# Patient Record
Sex: Female | Born: 1970 | Race: White | Hispanic: No | Marital: Married | Smoking: Former smoker
Health system: Southern US, Community
[De-identification: ages and names within clinical notes are randomized; demographics above are authoritative.]

## PROBLEM LIST (undated history)

## (undated) DIAGNOSIS — I10 Essential (primary) hypertension: Secondary | ICD-10-CM

## (undated) DIAGNOSIS — F32A Depression, unspecified: Secondary | ICD-10-CM

## (undated) HISTORY — PX: APPENDECTOMY: SHX54

## (undated) HISTORY — PX: CHOLECYSTECTOMY: SHX55

## (undated) HISTORY — PX: ABLATION: SHX5711

---

## 2012-07-20 ENCOUNTER — Emergency Department (INDEPENDENT_AMBULATORY_CARE_PROVIDER_SITE_OTHER)
Admission: EM | Admit: 2012-07-20 | Discharge: 2012-07-20 | Disposition: A | Discharge status: Home / Self Care | Payer: 59 | Source: Home / Self Care | Attending: Family Medicine | Admitting: Family Medicine

## 2012-07-20 ENCOUNTER — Encounter: Payer: Self-pay | Admitting: *Deleted

## 2012-07-20 DIAGNOSIS — B029 Zoster without complications: Secondary | ICD-10-CM

## 2012-07-20 MED ORDER — VALACYCLOVIR HCL 1 G PO TABS: 1000.0000 mg | ORAL_TABLET | Freq: Three times a day (TID) | ORAL | Status: DC

## 2012-07-20 MED ORDER — ONDANSETRON HCL 4 MG PO TABS: 4.0000 mg | ORAL_TABLET | Freq: Four times a day (QID) | ORAL | Status: DC

## 2012-07-20 MED ORDER — HYDROCODONE-ACETAMINOPHEN 5-325 MG PO TABS: 1.0000 | ORAL_TABLET | Freq: Four times a day (QID) | ORAL | Status: DC | PRN

## 2012-07-20 NOTE — ED Notes (Signed)
Patient c/o pain, itching, burning sensation on right upper side of back x 2 days.

## 2012-07-20 NOTE — ED Provider Notes (Signed)
History     CSN: [id number]  Arrival date & time 07/20/12  1320   First MD Initiated Contact with Patient 07/20/12 1339      Chief Complaint  Patient presents with  . Herpes Zoster       HPI Comments: Patient complains of onset of pain, burning, and itching in her right back and lateral chest in a band-like distribution.  She has had shingles two years ago and her present pain is exactly the same, although she has not seen a rash.  No cough or pleuritic pain.  No urinary symptoms.   No fevers, chills, and sweats   The history is provided by the patient.    History reviewed. No pertinent past medical history.  Past Surgical History  Procedure Laterality Date  . Appendectomy    . Cholecystectomy    . Ablation      Family history:  Father bladder cancer and type 2 diabetes;  Mother carcinoid tumors; maternal grandmother colon CA and COPD  History  Substance Use Topics  . Smoking status: Not on file  . Smokeless tobacco: Not on file  . Alcohol Use: Not on file    OB History   Grav Para Term Preterm Abortions TAB SAB Ect Mult Living                  Review of Systems  Constitutional: Positive for fatigue. Negative for fever.  HENT: Negative.   Eyes: Negative.   Respiratory: Negative.   Cardiovascular: Negative.   Gastrointestinal: Negative.   Genitourinary: Negative.   Musculoskeletal: Positive for back pain.  Skin: Negative for rash.  Neurological: Negative.     Allergies  Codeine; Morphine and related; and Other  Home Medications   Current Outpatient Rx  Name  Route  Sig  Dispense  Refill  . amitriptyline (ELAVIL) 10 MG tablet   Oral   Take 10 mg by mouth at bedtime.         Marland Kitchen escitalopram (LEXAPRO) 10 MG tablet   Oral   Take 10 mg by mouth daily.         . meloxicam (MOBIC) 15 MG tablet   Oral   Take 15 mg by mouth daily.         Marland Kitchen HYDROcodone-acetaminophen (NORCO/VICODIN) 5-325 MG per tablet   Oral   Take 1 tablet by mouth every 6  (six) hours as needed for pain.   12 tablet   0   . ondansetron (ZOFRAN) 4 MG tablet   Oral   Take 1 tablet (4 mg total) by mouth every 6 (six) hours. as needed for nausea   12 tablet   0   . valACYclovir (VALTREX) 1000 MG tablet   Oral   Take 1 tablet (1,000 mg total) by mouth 3 (three) times daily.   21 tablet   0     BP 141/95  Pulse 98  Temp(Src) 97.9 F (36.6 C) (Oral)  Resp 20  Ht 5' 1.5" (1.562 m)  Wt 189 lb 4 oz (85.843 kg)  BMI 35.18 kg/m2  SpO2 100%  Physical Exam  Nursing note and vitals reviewed. Constitutional: She is oriented to person, place, and time. She appears well-developed and well-nourished. No distress.  Patient is obese (BMI 35.2)  HENT:  Head: Normocephalic.  Mouth/Throat: Oropharynx is clear and moist.  Eyes: Conjunctivae are normal. Pupils are equal, round, and reactive to light.  Neck: Neck supple.  Cardiovascular: Normal heart sounds.   Pulmonary/Chest: Breath  sounds normal.  Abdominal: Soft. There is no tenderness.  Musculoskeletal: She exhibits no edema.  Lymphadenopathy:    She has no cervical adenopathy.  Neurological: She is alert and oriented to person, place, and time.  Skin: Skin is warm and dry. No abrasion, no bruising, no burn, no ecchymosis, no lesion, no petechiae and no rash noted. No erythema.     There is tenderness to palpation and pain (but no rash) in a dermatomal distribution on the right chest as noted on diagram.    ED Course  Procedures  none      1. Herpes zoster, early       MDM  Begin Valtrex.  Lortab and Zofran Q6hr prn Followup with Family Doctor if not improved in one week.         Lattie Haw, MD 07/22/12 1058

## 2012-07-22 ENCOUNTER — Telehealth: Payer: Self-pay | Admitting: Emergency Medicine

## 2018-03-12 ENCOUNTER — Encounter (HOSPITAL_BASED_OUTPATIENT_CLINIC_OR_DEPARTMENT_OTHER): Payer: Self-pay | Admitting: *Deleted

## 2018-03-12 ENCOUNTER — Other Ambulatory Visit: Payer: Self-pay

## 2018-03-12 ENCOUNTER — Emergency Department (HOSPITAL_BASED_OUTPATIENT_CLINIC_OR_DEPARTMENT_OTHER)
Admission: EM | Admit: 2018-03-12 | Discharge: 2018-03-12 | Disposition: A | Discharge status: Home / Self Care | Payer: Managed Care, Other (non HMO) | Attending: Emergency Medicine | Admitting: Emergency Medicine

## 2018-03-12 DIAGNOSIS — N390 Urinary tract infection, site not specified: Secondary | ICD-10-CM

## 2018-03-12 DIAGNOSIS — M545 Low back pain: Secondary | ICD-10-CM | POA: Diagnosis present

## 2018-03-12 DIAGNOSIS — Z79899 Other long term (current) drug therapy: Secondary | ICD-10-CM | POA: Diagnosis not present

## 2018-03-12 LAB — URINALYSIS, MICROSCOPIC (REFLEX)

## 2018-03-12 LAB — URINALYSIS, ROUTINE W REFLEX MICROSCOPIC
Bilirubin Urine: NEGATIVE
GLUCOSE, UA: NEGATIVE mg/dL
Ketones, ur: NEGATIVE mg/dL
Nitrite: POSITIVE — AB
PH: 7 (ref 5.0–8.0)
Specific Gravity, Urine: 1.01 (ref 1.005–1.030)

## 2018-03-12 LAB — PREGNANCY, URINE: Preg Test, Ur: NEGATIVE

## 2018-03-12 MED ORDER — CEPHALEXIN 250 MG PO CAPS
1000.0000 mg | ORAL_CAPSULE | Freq: Once | ORAL | Status: AC
  Administered 2018-03-12: 1000 mg via ORAL
  Filled 2018-03-12: qty 4

## 2018-03-12 MED ORDER — CEPHALEXIN 500 MG PO CAPS: 500.0000 mg | ORAL_CAPSULE | Freq: Two times a day (BID) | ORAL | 0 refills | Status: DC

## 2018-03-12 MED ORDER — FLUCONAZOLE 150 MG PO TABS: ORAL_TABLET | ORAL | 0 refills | Status: DC

## 2018-03-12 NOTE — ED Notes (Signed)
Patient is A&Ox4.  No signs of distress noted.  Please see providers complete history and physical exam.  

## 2018-03-12 NOTE — ED Triage Notes (Signed)
C/o bil back pain x 1 day w/o injury

## 2018-03-12 NOTE — ED Provider Notes (Signed)
[facility] Provider Note: Alyssa Dell, MD, FACEP  CSN: [id number] MRN: [id number] ARRIVAL: 03/12/18 at 0022 ROOM: [facility]   CHIEF COMPLAINT  Back Pain   HISTORY OF PRESENT ILLNESS  03/12/18 1:22 AM Alyssa Anthony is a 48 y.o. female developed low back pain yesterday afternoon.  There was no injury or lifting to cause this.  The pain is across her entire lower back and is dull in nature.  She rates it as a 6 out of 10.  It is worse with certain positions and with movement of the lower back but not with ambulation.  She denies associated numbness, weakness, bowel or bladder changes except her urine is cloudy.  She has taken acetaminophen, Mobic and Flexeril without relief.  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   History reviewed. No pertinent past medical history.  Past Surgical History:  Procedure Laterality Date  . ABLATION    . APPENDECTOMY    . CHOLECYSTECTOMY      No family history on file.  Social History   Tobacco Use  . Smoking status: Never Smoker  . Smokeless tobacco: Never Used  Substance Use Topics  . Alcohol use: Not Currently  . Drug use: Not Currently    Prior to Admission medications   Medication Sig Start Date End Date Taking? Authorizing Provider  omeprazole (PRILOSEC) 20 MG capsule Take 20 mg by mouth daily.   Yes [provider]  amitriptyline (ELAVIL) 10 MG tablet Take 10 mg by mouth at bedtime.    [provider]  escitalopram (LEXAPRO) 10 MG tablet Take 10 mg by mouth daily.    [provider]  HYDROcodone-acetaminophen (NORCO/VICODIN) 5-325 MG per tablet Take 1 tablet by mouth every 6 (six) hours as needed for pain. 07/20/12   Lattie Haw, MD  meloxicam (MOBIC) 15 MG tablet Take 15 mg by mouth daily.    [provider]  ondansetron (ZOFRAN) 4 MG tablet Take 1 tablet (4 mg total) by mouth every 6 (six) hours. as needed for nausea 07/20/12   Lattie Haw, MD  valACYclovir (VALTREX) 1000 MG tablet Take 1 tablet (1,000 mg total) by mouth 3 (three) times daily. 07/20/12   Lattie Haw, MD    Allergies Codeine; Morphine and related; and Other   REVIEW OF SYSTEMS  Negative except as noted here or in the History of Present Illness.   PHYSICAL EXAMINATION  Initial Vital Signs Blood pressure (!) 166/94, pulse 96, temperature 97.8 F (36.6 C), temperature source Oral, resp. rate 16, height 5' 1.5" (1.562 m), weight 99.3 kg, SpO2 100 %.  Examination General: Well-developed, well-nourished female in no acute distress; appearance consistent with age of record HENT: normocephalic; atraumatic Eyes: pupils equal, round and reactive to light; extraocular muscles intact Neck: supple Heart: regular rate and rhythm Lungs: clear to auscultation bilaterally Back: Lower back tenderness; negative straight leg raise bilaterally Abdomen: soft; nondistended; nontender; bowel sounds present Extremities: No deformity; full range of motion; pulses normal Neurologic: Awake, alert and oriented; motor function intact in all extremities and symmetric; decreased sensation of left lower leg in pattern patient states is chronic; no facial droop Skin: Warm and dry Psychiatric: Normal mood and affect   RESULTS  Summary of this visit's results, reviewed by myself:   EKG Interpretation  Date/Time:    Ventricular Rate:    PR Interval:    QRS Duration:   QT Interval:  QTC Calculation:   R Axis:     Text Interpretation:        Laboratory Studies: Results for orders placed or performed during the hospital encounter of 03/12/18 (from the past 24 hour(s))  Urinalysis, Routine w reflex microscopic     Status: Abnormal   Collection Time: 03/12/18  1:29 AM  Result Value Ref Range   Color, Urine YELLOW YELLOW   APPearance HAZY (A) CLEAR   Specific Gravity, Urine 1.010 1.005 - 1.030   pH 7.0 5.0 - 8.0   Glucose, UA NEGATIVE NEGATIVE mg/dL   Hgb urine  dipstick SMALL (A) NEGATIVE   Bilirubin Urine NEGATIVE NEGATIVE   Ketones, ur NEGATIVE NEGATIVE mg/dL   Protein, ur >315 (A) NEGATIVE mg/dL   Nitrite POSITIVE (A) NEGATIVE   Leukocytes, UA TRACE (A) NEGATIVE  Pregnancy, urine     Status: None   Collection Time: 03/12/18  1:29 AM  Result Value Ref Range   Preg Test, Ur NEGATIVE NEGATIVE  Urinalysis, Microscopic (reflex)     Status: Abnormal   Collection Time: 03/12/18  1:29 AM  Result Value Ref Range   RBC / HPF 0-5 0 - 5 RBC/hpf   WBC, UA 11-20 0 - 5 WBC/hpf   Bacteria, UA MANY (A) NONE SEEN   Squamous Epithelial / LPF 0-5 0 - 5   Non Squamous Epithelial PRESENT (A) NONE SEEN   Imaging Studies: No results found.  ED COURSE and MDM  Nursing notes and initial vitals signs, including pulse oximetry, reviewed.  Vitals:   03/12/18 0030 03/12/18 0031  BP: (!) 166/94   Pulse: 96   Resp: 16   Temp: 97.8 F (36.6 C)   TempSrc: Oral   SpO2: 100%   Weight:  99.3 kg  Height:  5' 1.5" (1.562 m)   1:55 AM Urinalysis consistent with urinary tract infection which likely accounts for her pain.  PROCEDURES    ED DIAGNOSES     ICD-10-CM   1. Lower urinary tract infectious disease N39.0        Jaylene Schrom, MD 03/12/18 0157

## 2018-03-12 NOTE — ED Notes (Signed)
No urinary problems with lower back pain.

## 2018-03-13 LAB — URINE CULTURE

## 2020-02-02 ENCOUNTER — Encounter (HOSPITAL_BASED_OUTPATIENT_CLINIC_OR_DEPARTMENT_OTHER): Payer: Self-pay | Admitting: *Deleted

## 2020-02-02 ENCOUNTER — Emergency Department (HOSPITAL_BASED_OUTPATIENT_CLINIC_OR_DEPARTMENT_OTHER): Payer: No Typology Code available for payment source

## 2020-02-02 ENCOUNTER — Inpatient Hospital Stay (HOSPITAL_BASED_OUTPATIENT_CLINIC_OR_DEPARTMENT_OTHER)
Admission: EM | Admit: 2020-02-02 | Discharge: 2020-02-04 | DRG: 392 | Disposition: A | Discharge status: Home / Self Care | Payer: No Typology Code available for payment source | Attending: Internal Medicine | Admitting: Internal Medicine

## 2020-02-02 ENCOUNTER — Other Ambulatory Visit: Payer: Self-pay

## 2020-02-02 DIAGNOSIS — Z888 Allergy status to other drugs, medicaments and biological substances status: Secondary | ICD-10-CM

## 2020-02-02 DIAGNOSIS — R197 Diarrhea, unspecified: Secondary | ICD-10-CM | POA: Diagnosis present

## 2020-02-02 DIAGNOSIS — A09 Infectious gastroenteritis and colitis, unspecified: Secondary | ICD-10-CM | POA: Diagnosis not present

## 2020-02-02 DIAGNOSIS — I10 Essential (primary) hypertension: Secondary | ICD-10-CM | POA: Diagnosis present

## 2020-02-02 DIAGNOSIS — Z6841 Body Mass Index (BMI) 40.0 and over, adult: Secondary | ICD-10-CM

## 2020-02-02 DIAGNOSIS — Z885 Allergy status to narcotic agent status: Secondary | ICD-10-CM

## 2020-02-02 DIAGNOSIS — Z791 Long term (current) use of non-steroidal anti-inflammatories (NSAID): Secondary | ICD-10-CM | POA: Diagnosis not present

## 2020-02-02 DIAGNOSIS — Z79899 Other long term (current) drug therapy: Secondary | ICD-10-CM

## 2020-02-02 DIAGNOSIS — F32A Depression, unspecified: Secondary | ICD-10-CM | POA: Diagnosis present

## 2020-02-02 DIAGNOSIS — R109 Unspecified abdominal pain: Secondary | ICD-10-CM | POA: Diagnosis present

## 2020-02-02 DIAGNOSIS — R1084 Generalized abdominal pain: Secondary | ICD-10-CM

## 2020-02-02 DIAGNOSIS — Z20822 Contact with and (suspected) exposure to covid-19: Secondary | ICD-10-CM | POA: Diagnosis present

## 2020-02-02 DIAGNOSIS — K529 Noninfective gastroenteritis and colitis, unspecified: Secondary | ICD-10-CM

## 2020-02-02 DIAGNOSIS — K567 Ileus, unspecified: Secondary | ICD-10-CM

## 2020-02-02 HISTORY — DX: Depression, unspecified: F32.A

## 2020-02-02 HISTORY — DX: Essential (primary) hypertension: I10

## 2020-02-02 LAB — CBC WITH DIFFERENTIAL/PLATELET
Abs Immature Granulocytes: 0.04 10*3/uL (ref 0.00–0.07)
Basophils Absolute: 0.1 10*3/uL (ref 0.0–0.1)
Basophils Relative: 1 %
Eosinophils Absolute: 0.3 10*3/uL (ref 0.0–0.5)
Eosinophils Relative: 3 %
HCT: 42.1 % (ref 36.0–46.0)
Hemoglobin: 14.7 g/dL (ref 12.0–15.0)
Immature Granulocytes: 0 %
Lymphocytes Relative: 25 %
Lymphs Abs: 2.9 10*3/uL (ref 0.7–4.0)
MCH: 29.2 pg (ref 26.0–34.0)
MCHC: 34.9 g/dL (ref 30.0–36.0)
MCV: 83.7 fL (ref 80.0–100.0)
Monocytes Absolute: 0.8 10*3/uL (ref 0.1–1.0)
Monocytes Relative: 7 %
Neutro Abs: 7.5 10*3/uL (ref 1.7–7.7)
Neutrophils Relative %: 64 %
Platelets: 318 10*3/uL (ref 150–400)
RBC: 5.03 MIL/uL (ref 3.87–5.11)
RDW: 12.9 % (ref 11.5–15.5)
WBC: 11.6 10*3/uL — ABNORMAL HIGH (ref 4.0–10.5)
nRBC: 0 % (ref 0.0–0.2)

## 2020-02-02 LAB — HEPATIC FUNCTION PANEL
ALT: 59 U/L — ABNORMAL HIGH (ref 0–44)
AST: 46 U/L — ABNORMAL HIGH (ref 15–41)
Albumin: 5 g/dL (ref 3.5–5.0)
Alkaline Phosphatase: 128 U/L — ABNORMAL HIGH (ref 38–126)
Bilirubin, Direct: 0.2 mg/dL (ref 0.0–0.2)
Indirect Bilirubin: 0.4 mg/dL (ref 0.3–0.9)
Total Bilirubin: 0.6 mg/dL (ref 0.3–1.2)
Total Protein: 8.6 g/dL — ABNORMAL HIGH (ref 6.5–8.1)

## 2020-02-02 LAB — RESP PANEL BY RT-PCR (FLU A&B, COVID) ARPGX2
Influenza A by PCR: NEGATIVE
Influenza B by PCR: NEGATIVE
SARS Coronavirus 2 by RT PCR: NEGATIVE

## 2020-02-02 LAB — BASIC METABOLIC PANEL
Anion gap: 13 (ref 5–15)
BUN: 14 mg/dL (ref 6–20)
CO2: 25 mmol/L (ref 22–32)
Calcium: 10.3 mg/dL (ref 8.9–10.3)
Chloride: 102 mmol/L (ref 98–111)
Creatinine, Ser: 0.83 mg/dL (ref 0.44–1.00)
GFR, Estimated: >60 mL/min (ref >60)
Glucose, Bld: 105 mg/dL — ABNORMAL HIGH (ref 70–99)
Potassium: 4.4 mmol/L (ref 3.5–5.1)
Sodium: 140 mmol/L (ref 135–145)

## 2020-02-02 LAB — LIPASE, BLOOD: Lipase: 25 U/L (ref 11–51)

## 2020-02-02 LAB — TROPONIN I (HIGH SENSITIVITY)
Troponin I (High Sensitivity): <2 ng/L (ref <18)
Troponin I (High Sensitivity): <2 ng/L (ref <18)

## 2020-02-02 MED ORDER — FENTANYL CITRATE (PF) 100 MCG/2ML IJ SOLN
50.0000 ug | Freq: Once | INTRAMUSCULAR | Status: AC
  Administered 2020-02-02: 50 ug via INTRAVENOUS
  Filled 2020-02-02: qty 2

## 2020-02-02 MED ORDER — ONDANSETRON HCL 4 MG/2ML IJ SOLN
4.0000 mg | Freq: Once | INTRAMUSCULAR | Status: AC
  Administered 2020-02-02: 4 mg via INTRAVENOUS
  Filled 2020-02-02: qty 2

## 2020-02-02 MED ORDER — IOHEXOL 350 MG/ML SOLN
100.0000 mL | Freq: Once | INTRAVENOUS | Status: AC | PRN
  Administered 2020-02-02: 100 mL via INTRAVENOUS

## 2020-02-02 MED ORDER — SODIUM CHLORIDE 0.9 % IV SOLN: Freq: Once | INTRAVENOUS | Status: AC

## 2020-02-02 MED ORDER — SODIUM CHLORIDE 0.9 % IV BOLUS
1000.0000 mL | Freq: Once | INTRAVENOUS | Status: AC
  Administered 2020-02-02: 1000 mL via INTRAVENOUS

## 2020-02-02 MED ORDER — HALOPERIDOL LACTATE 5 MG/ML IJ SOLN
2.0000 mg | Freq: Once | INTRAMUSCULAR | Status: AC
  Administered 2020-02-02: 2 mg via INTRAVENOUS
  Filled 2020-02-02: qty 1

## 2020-02-02 NOTE — ED Triage Notes (Signed)
Chest pressure, abdominal and lower back pain with nausea starting after lunch today. No relief with Tums.

## 2020-02-02 NOTE — ED Provider Notes (Signed)
[facility] EMERGENCY DEPARTMENT Provider Note   CSN: [id number] Arrival date & time: 02/02/20  1838     History Chief Complaint  Patient presents with  . Chest Pain    Alyssa Anthony is a 49 y.o. female.  The history is provided by the patient.  Chest Pain Pain location:  L chest, R chest and epigastric Pain quality: aching   Pain radiates to:  Epigastrium and lower back Pain severity:  Severe Onset quality:  Gradual Timing:  Constant Progression:  Unchanged Chronicity:  New Context: at rest   Relieved by:  Nothing Worsened by:  Nothing Associated symptoms: abdominal pain, back pain and nausea   Associated symptoms: no cough, no fever, no palpitations, no shortness of breath and no vomiting   Risk factors: no high cholesterol and no hypertension        History reviewed. No pertinent past medical history.  Patient Active Problem List   Diagnosis Date Noted  . Abdominal pain 02/02/2020    Past Surgical History:  Procedure Laterality Date  . ABLATION    . APPENDECTOMY    . CHOLECYSTECTOMY       OB History   No obstetric history on file.     No family history on file.  Social History   Tobacco Use  . Smoking status: Never Smoker  . Smokeless tobacco: Never Used  Substance Use Topics  . Alcohol use: Not Currently  . Drug use: Not Currently    Home Medications Prior to Admission medications   Medication Sig Start Date End Date Taking? Authorizing Provider  amitriptyline (ELAVIL) 10 MG tablet Take 10 mg by mouth at bedtime.   Yes [provider]  amLODipine (NORVASC) 2.5 MG tablet Take by mouth. 08/25/19 08/24/20 Yes [provider]  escitalopram (LEXAPRO) 10 MG tablet Take 10 mg by mouth daily.   Yes [provider]  meloxicam (MOBIC) 15 MG tablet Take 15 mg by mouth daily.   Yes [provider]  omeprazole (PRILOSEC) 20 MG capsule Take 20 mg by mouth daily.   Yes [provider]   cephALEXin (KEFLEX) 500 MG capsule Take 1 capsule (500 mg total) by mouth 2 (two) times daily. 03/12/18   Molpus, John, MD  fluconazole (DIFLUCAN) 150 MG tablet Take 1 tablet as needed for vaginal yeast infection.  May repeat in 3 days if symptoms persist. 03/12/18   Molpus, John, MD    Allergies    Codeine, Morphine and related, and Other  Review of Systems   Review of Systems  Constitutional: Negative for chills and fever.  HENT: Negative for ear pain and sore throat.   Eyes: Negative for pain and visual disturbance.  Respiratory: Negative for cough and shortness of breath.   Cardiovascular: Positive for chest pain. Negative for palpitations.  Gastrointestinal: Positive for abdominal pain, diarrhea and nausea. Negative for vomiting.  Genitourinary: Negative for dysuria and hematuria.  Musculoskeletal: Positive for back pain. Negative for arthralgias.  Skin: Negative for color change and rash.  Neurological: Negative for seizures and syncope.  All other systems reviewed and are negative.   Physical Exam Updated Vital Signs  ED Triage Vitals  Enc Vitals Group     BP 02/02/20 1849 (!) 144/94     Pulse Rate 02/02/20 1849 99     Resp 02/02/20 1849 18     Temp 02/02/20 1849 97.9 F (36.6 C)     Temp Source 02/02/20 1849 Oral     SpO2 02/02/20  1849 100 %     Weight 02/02/20 1844 222 lb 9.6 oz (101 kg)     Height 02/02/20 1844 5' 1.5" (1.562 m)     Head Circumference --      Peak Flow --      Pain Score 02/02/20 1945 9     Pain Loc --      Pain Edu? --      Excl. in GC? --     Physical Exam Vitals and nursing note reviewed.  Constitutional:      General: She is in acute distress.     Appearance: She is well-developed. She is ill-appearing.  HENT:     Head: Normocephalic and atraumatic.  Eyes:     Conjunctiva/sclera: Conjunctivae normal.  Cardiovascular:     Rate and Rhythm: Normal rate and regular rhythm.     Pulses:          Radial pulses are 2+ on the right side and  2+ on the left side.       Dorsalis pedis pulses are 2+ on the right side and 2+ on the left side.     Heart sounds: Normal heart sounds. No murmur heard.   Pulmonary:     Effort: Pulmonary effort is normal. No respiratory distress.     Breath sounds: Normal breath sounds. No decreased breath sounds, wheezing or rhonchi.  Abdominal:     Palpations: Abdomen is soft.     Tenderness: There is abdominal tenderness (diffusely).  Musculoskeletal:     Cervical back: Neck supple.     Right lower leg: No edema.     Left lower leg: No edema.  Skin:    General: Skin is warm and dry.  Neurological:     Mental Status: She is alert.     ED Results / Procedures / Treatments   Labs (all labs ordered are listed, but only abnormal results are displayed) Labs Reviewed  CBC WITH DIFFERENTIAL/PLATELET - Abnormal; Notable for the following components:      Result Value   WBC 11.6 (*)    All other components within normal limits  BASIC METABOLIC PANEL - Abnormal; Notable for the following components:   Glucose, Bld 105 (*)    All other components within normal limits  HEPATIC FUNCTION PANEL - Abnormal; Notable for the following components:   Total Protein 8.6 (*)    AST 46 (*)    ALT 59 (*)    Alkaline Phosphatase 128 (*)    All other components within normal limits  RESP PANEL BY RT-PCR (FLU A&B, COVID) ARPGX2  LIPASE, BLOOD  TROPONIN I (HIGH SENSITIVITY)  TROPONIN I (HIGH SENSITIVITY)    EKG EKG Interpretation  Date/Time:  Monday February 02 2020 19:18:29 EST Ventricular Rate:  89 PR Interval:    QRS Duration: 91 QT Interval:  412 QTC Calculation: 502 R Axis:   19 Text Interpretation: Sinus rhythm Low voltage, precordial leads Borderline prolonged QT interval Confirmed by Virgina Norfolk [phone]) on 02/02/2020 7:20:18 PM   Radiology DG Chest Portable 1 View  Result Date: 02/02/2020 CLINICAL DATA:  Chest pressure, abdominal lower back pain with nausea EXAM: PORTABLE CHEST 1 VIEW  COMPARISON:  None. FINDINGS: No consolidation, features of edema, pneumothorax, or effusion. Pulmonary vascularity is normally distributed. The cardiomediastinal contours are unremarkable. No acute osseous or soft tissue abnormality. Telemetry leads overlie the chest. IMPRESSION: No acute cardiopulmonary abnormality. Electronically Signed   By: Kreg Shropshire M.D.   On: 02/02/2020 19:33  CT Angio Chest/Abd/Pel for Dissection W and/or Wo Contrast  Result Date: 02/02/2020 CLINICAL DATA:  49 year old female with abdominal pain. Concern for aortic dissection. EXAM: CT ANGIOGRAPHY CHEST, ABDOMEN AND PELVIS TECHNIQUE: Non-contrast CT of the chest was initially obtained. Multidetector CT imaging through the chest, abdomen and pelvis was performed using the standard protocol during bolus administration of intravenous contrast. Multiplanar reconstructed images and MIPs were obtained and reviewed to evaluate the vascular anatomy. CONTRAST:  OMNIPAQUE IOHEXOL 350 MG/ML SOLN COMPARISON:  CT abdomen pelvis dated 10/31/2010. FINDINGS: Evaluation is limited due to streak artifact caused by patient's arms and body habitus. CTA CHEST FINDINGS Cardiovascular: There is no cardiomegaly or pericardial effusion. Minimal atherosclerotic calcification of the thoracic aorta. No aneurysmal dilatation or dissection. The origins of the great vessels of the aortic arch appear patent as visualized. Evaluation of the pulmonary arteries is limited due to suboptimal opacification and visualization of the peripheral branches. No large or central pulmonary artery embolus identified. Mediastinum/Nodes: There is no hilar or mediastinal adenopathy. Fluid noted within the esophagus which may represent reflux or delayed clearance. The thyroid gland is grossly unremarkable as visualized. No mediastinal fluid collection. Lungs/Pleura: The lungs are clear. There is no pleural effusion pneumothorax. The central airways are patent. Musculoskeletal:  No chest wall abnormality. No acute or significant osseous findings. Review of the MIP images confirms the above findings. CTA ABDOMEN AND PELVIS FINDINGS VASCULAR Aorta: Mild atherosclerotic calcification. No aneurysmal dilatation or dissection. No periaortic inflammation. Celiac: Patent without evidence of aneurysm, dissection, vasculitis or significant stenosis. SMA: Patent without evidence of aneurysm, dissection, vasculitis or significant stenosis. Renals: Both renal arteries are patent without evidence of aneurysm, dissection, vasculitis, fibromuscular dysplasia or significant stenosis. IMA: Patent without evidence of aneurysm, dissection, vasculitis or significant stenosis. Inflow: Patent without evidence of aneurysm, dissection, vasculitis or significant stenosis. Veins: No obvious venous abnormality within the limitations of this arterial phase study. Review of the MIP images confirms the above findings. NON-VASCULAR No intra-abdominal free air or free fluid. Hepatobiliary: Probable fatty infiltration of the liver. Evaluation of the liver is very limited due to streak artifact. No intrahepatic biliary ductal dilatation. Cholecystectomy. Pancreas: The pancreas is unremarkable as visualized. Spleen: Normal in size without focal abnormality. Adrenals/Urinary Tract: The adrenal glands unremarkable. There is no hydronephrosis on either side. The visualized ureters and urinary bladder appear unremarkable. Stomach/Bowel: The stomach is distended with fluid content. There is sigmoid diverticulosis without active inflammatory changes. Multiple top-normal caliber fluid-filled loops of proximal small bowel within the abdomen measure up to 2.6 cm in diameter. There is mild inflammatory changes and engorgement of the associated mesentery. The distal small bowel and terminal ileum demonstrate a normal caliber. Findings may represent enteritis with associated ileus or and early small-bowel obstruction. Clinical  correlation and follow-up recommended. The colon is predominantly collapsed. Appendectomy. Lymphatic: No adenopathy. Reproductive: The uterus is grossly unremarkable. No adnexal masses. Other: None Musculoskeletal: Degenerative changes primarily at L5-S1. No acute osseous pathology. Review of the MIP images confirms the above findings. IMPRESSION: 1. No aortic dissection or aneurysm. No CT evidence of central pulmonary artery embolus. 2. Findings may represent enteritis with associated ileus versus an early small-bowel obstruction. Clinical correlation is recommended. 3. Sigmoid diverticulosis. Electronically Signed   By: Elgie Collard M.D.   On: 02/02/2020 20:12    Procedures Procedures (including critical care time)  Medications Ordered in ED Medications  fentaNYL (SUBLIMAZE) injection 50 mcg (50 mcg Intravenous Given 02/02/20 1925)  sodium chloride 0.9 %  bolus 1,000 mL (0 mLs Intravenous Stopped 02/02/20 2026)  ondansetron (ZOFRAN) injection 4 mg (4 mg Intravenous Given 02/02/20 1925)  iohexol (OMNIPAQUE) 350 MG/ML injection 100 mL (100 mLs Intravenous Contrast Given 02/02/20 1947)  fentaNYL (SUBLIMAZE) injection 50 mcg (50 mcg Intravenous Given 02/02/20 2035)  0.9 %  sodium chloride infusion ( Intravenous New Bag/Given 02/02/20 2052)  haloperidol lactate (HALDOL) injection 2 mg (2 mg Intravenous Given 02/02/20 2110)    ED Course  I have reviewed the triage vital signs and the nursing notes.  Pertinent labs & imaging results that were available during my care of the patient were reviewed by me and considered in my medical decision making (see chart for details).    MDM Rules/Calculators/A&P                          Alyssa Anthony is a 49 year old female who presents to the ED with abdominal pain, chest pain.  Patient with normal vitals.  No fever.  Pain has been ongoing for the last several hours.  Having severe pain radiating to her back.  Has felt pale and sweaty.  No real  suspicious food intake.  Patient with no significant cardiac risk factors.  EKG shows sinus rhythm.  Troponin normal x2.  Dissection study was ordered given severity of her pain and constellation of chest pain, back pain, abdominal pain.  Patient was diaphoretic upon arrival as well.  CTA showed enteritis with possible ileus versus early small bowel obstruction.  Lab work was overall otherwise unremarkable.  No significant leukocytosis or fever.  Multiple rounds of antiemetics and IV pain medicine were given with some improvement.  IV fluids were started as well.  Patient admitted to medicine.  We will continue to reevaluate while patient is here and may consider NG tube for possible bowel obstruction.  Patient difficult to get a good abdominal exam due to pain.  She has had 2 more episodes of diarrhea since being here and possible that she has a severe enteritis possibly foodborne.  She has had multiple abdominal surgeries and this could be an early small bowel obstruction as well.  She is not having any vomiting at this time and will continue to hold NG tube unless patient becomes increasingly more symptomatic.  This chart was dictated using voice recognition software.  Despite best efforts to proofread,  errors can occur which can change the documentation meaning.    Final Clinical Impression(s) / ED Diagnoses Final diagnoses:  Enteritis  Generalized abdominal pain  Ileus [facility](HCC)    Rx / DC Orders ED Discharge Orders    None       Virgina NorfolkCuratolo, Keari Miu, DO 02/02/20 2134

## 2020-02-02 NOTE — ED Notes (Signed)
Patient transported to CT 

## 2020-02-02 NOTE — ED Notes (Signed)
Rt Pedal pulse by doppler 86 and Lt pedal pulse by doppler 90

## 2020-02-03 ENCOUNTER — Encounter (HOSPITAL_COMMUNITY): Payer: Self-pay | Admitting: Family Medicine

## 2020-02-03 DIAGNOSIS — Z79899 Other long term (current) drug therapy: Secondary | ICD-10-CM | POA: Diagnosis not present

## 2020-02-03 DIAGNOSIS — I10 Essential (primary) hypertension: Secondary | ICD-10-CM | POA: Diagnosis present

## 2020-02-03 DIAGNOSIS — Z20822 Contact with and (suspected) exposure to covid-19: Secondary | ICD-10-CM | POA: Diagnosis present

## 2020-02-03 DIAGNOSIS — Z885 Allergy status to narcotic agent status: Secondary | ICD-10-CM | POA: Diagnosis not present

## 2020-02-03 DIAGNOSIS — R1084 Generalized abdominal pain: Secondary | ICD-10-CM

## 2020-02-03 DIAGNOSIS — Z6841 Body Mass Index (BMI) 40.0 and over, adult: Secondary | ICD-10-CM | POA: Diagnosis not present

## 2020-02-03 DIAGNOSIS — F32A Depression, unspecified: Secondary | ICD-10-CM | POA: Diagnosis present

## 2020-02-03 DIAGNOSIS — K529 Noninfective gastroenteritis and colitis, unspecified: Secondary | ICD-10-CM | POA: Diagnosis present

## 2020-02-03 DIAGNOSIS — Z888 Allergy status to other drugs, medicaments and biological substances status: Secondary | ICD-10-CM | POA: Diagnosis not present

## 2020-02-03 DIAGNOSIS — R197 Diarrhea, unspecified: Secondary | ICD-10-CM | POA: Diagnosis present

## 2020-02-03 DIAGNOSIS — A09 Infectious gastroenteritis and colitis, unspecified: Secondary | ICD-10-CM | POA: Diagnosis present

## 2020-02-03 DIAGNOSIS — Z791 Long term (current) use of non-steroidal anti-inflammatories (NSAID): Secondary | ICD-10-CM | POA: Diagnosis not present

## 2020-02-03 LAB — BASIC METABOLIC PANEL
Anion gap: 12 (ref 5–15)
BUN: 10 mg/dL (ref 6–20)
CO2: 23 mmol/L (ref 22–32)
Calcium: 9.2 mg/dL (ref 8.9–10.3)
Chloride: 105 mmol/L (ref 98–111)
Creatinine, Ser: 0.71 mg/dL (ref 0.44–1.00)
GFR, Estimated: >60 mL/min (ref >60)
Glucose, Bld: 132 mg/dL — ABNORMAL HIGH (ref 70–99)
Potassium: 4.1 mmol/L (ref 3.5–5.1)
Sodium: 140 mmol/L (ref 135–145)

## 2020-02-03 LAB — CBC
HCT: 38.1 % (ref 36.0–46.0)
Hemoglobin: 13 g/dL (ref 12.0–15.0)
MCH: 29.3 pg (ref 26.0–34.0)
MCHC: 34.1 g/dL (ref 30.0–36.0)
MCV: 85.8 fL (ref 80.0–100.0)
Platelets: 283 10*3/uL (ref 150–400)
RBC: 4.44 MIL/uL (ref 3.87–5.11)
RDW: 12.9 % (ref 11.5–15.5)
WBC: 14.2 10*3/uL — ABNORMAL HIGH (ref 4.0–10.5)
nRBC: 0 % (ref 0.0–0.2)

## 2020-02-03 LAB — HIV ANTIBODY (ROUTINE TESTING W REFLEX): HIV Screen 4th Generation wRfx: NONREACTIVE

## 2020-02-03 MED ORDER — FENTANYL CITRATE (PF) 100 MCG/2ML IJ SOLN
25.0000 ug | INTRAMUSCULAR | Status: DC | PRN
  Administered 2020-02-03 (×3): 25 ug via INTRAVENOUS
  Filled 2020-02-03 (×3): qty 2

## 2020-02-03 MED ORDER — ACETAMINOPHEN 650 MG RE SUPP: 650.0000 mg | Freq: Four times a day (QID) | RECTAL | Status: DC | PRN

## 2020-02-03 MED ORDER — ONDANSETRON HCL 4 MG/2ML IJ SOLN: 4.0000 mg | Freq: Four times a day (QID) | INTRAMUSCULAR | Status: DC | PRN

## 2020-02-03 MED ORDER — ACETAMINOPHEN 325 MG PO TABS: 650.0000 mg | ORAL_TABLET | Freq: Four times a day (QID) | ORAL | Status: DC | PRN

## 2020-02-03 MED ORDER — ONDANSETRON HCL 4 MG PO TABS: 4.0000 mg | ORAL_TABLET | Freq: Four times a day (QID) | ORAL | Status: DC | PRN

## 2020-02-03 MED ORDER — SODIUM CHLORIDE 0.9 % IV SOLN
2.0000 g | INTRAVENOUS | Status: DC
  Administered 2020-02-03 – 2020-02-04 (×2): 2 g via INTRAVENOUS
  Filled 2020-02-03 (×2): qty 2

## 2020-02-03 MED ORDER — FENTANYL CITRATE (PF) 100 MCG/2ML IJ SOLN
50.0000 ug | INTRAMUSCULAR | Status: DC | PRN
  Administered 2020-02-03: 10:00:00 50 ug via INTRAVENOUS
  Filled 2020-02-03: qty 2

## 2020-02-03 MED ORDER — FENTANYL CITRATE (PF) 100 MCG/2ML IJ SOLN: 12.5000 ug | INTRAMUSCULAR | Status: DC | PRN

## 2020-02-03 MED ORDER — ENOXAPARIN SODIUM 40 MG/0.4ML ~~LOC~~ SOLN
40.0000 mg | SUBCUTANEOUS | Status: DC
  Administered 2020-02-03 – 2020-02-04 (×2): 40 mg via SUBCUTANEOUS
  Filled 2020-02-03 (×2): qty 0.4

## 2020-02-03 MED ORDER — ONDANSETRON HCL 4 MG/2ML IJ SOLN: 4.0000 mg | Freq: Once | INTRAMUSCULAR | Status: DC

## 2020-02-03 MED ORDER — ONDANSETRON HCL 4 MG/2ML IJ SOLN
INTRAMUSCULAR | Status: AC
  Administered 2020-02-03: 4 mg
  Filled 2020-02-03: qty 2

## 2020-02-03 MED ORDER — METRONIDAZOLE IN NACL 5-0.79 MG/ML-% IV SOLN
500.0000 mg | Freq: Three times a day (TID) | INTRAVENOUS | Status: DC
  Administered 2020-02-03 – 2020-02-04 (×5): 500 mg via INTRAVENOUS
  Filled 2020-02-03 (×5): qty 100

## 2020-02-03 MED ORDER — LACTATED RINGERS IV SOLN: INTRAVENOUS | Status: DC

## 2020-02-03 NOTE — Progress Notes (Signed)
   Patient seen and examined at bedside, patient admitted after midnight, please see earlier detailed admission note by Carlton Adam, MD. Briefly, patient presented secondary to abdominal pain with concern for enteritis. Started on Ceftriaxone/Flagyl. GI pathogen panel pending.  Subjective: Abdominal pain is improved from admission. Still with diarrhea with multiple loose stools.  BP (!) 147/82 (BP Location: Left Arm)   Pulse 98   Temp 98.8 F (37.1 C) (Oral)   Resp 17   Ht 5' 1.5" (1.562 m)   Wt 99.8 kg   SpO2 98%   BMI 40.90 kg/m   General exam: Appears calm and comfortable Respiratory system: Clear to auscultation. Respiratory effort normal. Cardiovascular system: S1 & S2 heard, RRR. No murmurs, rubs, gallops or clicks. Gastrointestinal system: Abdomen is mildly distended, soft and diffusely mild-moderately tender. No organomegaly or masses felt. Normal bowel sounds heard. Central nervous system: Alert and oriented. No focal neurological deficits. Musculoskeletal: No edema. No calf tenderness Skin: No cyanosis. No rashes Psychiatry: Judgement and insight appear normal. Mood & affect appropriate.   Brief assessment/Plan:  Enteritis Initial concern for possible SBO or ileus but clinically does not correlate. Unsure of etiology of enteritis but likely infectious. Improving on Ceftriaxone/Flagyl -Continue Ceftriaxone/Flagyl -Follow-up GI pathogen panel -Advance to soft diet -Continue IV fluids until oral intake improves  Family communication: None DVT prophylaxis: Lovenox Disposition: Discharge home likely in 24-48 hours  Jacquelin Hawking, MD [facility] 02/03/2020, 3:45 PM

## 2020-02-03 NOTE — H&P (Signed)
History and Physical    Alyssa PersonsDebra Lynn Anthony ZOX:[id number] DOB: August 09, 1970 DOA: 02/02/2020  PCP: Wilburn MylarKelly, Samuel S, MD  Patient coming from: Home  I have personally briefly reviewed patient's old medical records [facility]  Chief Complaint: Abd pain  HPI: Alyssa Anthony is a 49 y.o. female with medical history significant of HTN, depression.  Pt presents to ED with c/o abd pain radiation into chest and epigastric area as well as low back.  Pain is severe.  Onset gradually throughout the day today.  Diarrhea started after arrival to the ED, has had 5-6 episodes of diarrhea thus far.  Has nausea but no vomiting.  No SOB, no cough, no fever.  No blood in stool.   ED Course: CTA chest/abd/pelvis: 1) no dissection 2) no central PE 3) enteritis with associated ileus vs early SBO  Trop neg x2  Abd pain improved at this time (either from the fentanyl they gave her or the multiple BMs).   Review of Systems: As per HPI, otherwise all review of systems negative.  Past Medical History:  Diagnosis Date  . Depression   . HTN (hypertension)     Past Surgical History:  Procedure Laterality Date  . ABLATION    . APPENDECTOMY    . CHOLECYSTECTOMY       reports that she has never smoked. She has never used smokeless tobacco. She reports previous alcohol use. She reports previous drug use.  Allergies  Allergen Reactions  . Codeine   . Morphine And Related Hives  . Other     Mycin drugs     No family history on file. No sick contacts No early onset CAD.  Prior to Admission medications   Medication Sig Start Date End Date Taking? Authorizing Provider  amitriptyline (ELAVIL) 10 MG tablet Take 10 mg by mouth at bedtime.   Yes [provider]  amLODipine (NORVASC) 2.5 MG tablet Take by mouth. 08/25/19 08/24/20 Yes [provider]  escitalopram (LEXAPRO) 10 MG tablet Take 10 mg by mouth daily.   Yes [provider]  meloxicam (MOBIC) 15 MG  tablet Take 15 mg by mouth daily.   Yes [provider]  omeprazole (PRILOSEC) 20 MG capsule Take 20 mg by mouth daily.   Yes [provider]  cephALEXin (KEFLEX) 500 MG capsule Take 1 capsule (500 mg total) by mouth 2 (two) times daily. 03/12/18   Molpus, John, MD  fluconazole (DIFLUCAN) 150 MG tablet Take 1 tablet as needed for vaginal yeast infection.  May repeat in 3 days if symptoms persist. 03/12/18   Molpus, Jonny RuizJohn, MD    Physical Exam: Vitals:   02/02/20 2359 02/03/20 0049 02/03/20 0106 02/03/20 0106  BP: (!) 142/85  (!) 172/98 (!) 161/94  Pulse: 88  (!) 103 98  Resp: 18     Temp: 98 F (36.7 C)  97.7 F (36.5 C)   TempSrc:   Oral   SpO2: 99%  98%   Weight:  99.8 kg    Height:  5' 1.5" (1.562 m)      Constitutional: NAD, calm, comfortable Eyes: PERRL, lids and conjunctivae normal ENMT: Mucous membranes are moist. Posterior pharynx clear of any exudate or lesions.Normal dentition.  Neck: normal, supple, no masses, no thyromegaly Respiratory: clear to auscultation bilaterally, no wheezing, no crackles. Normal respiratory effort. No accessory muscle use.  Cardiovascular: Regular rate and rhythm, no murmurs / rubs / gallops. No extremity edema. 2+ pedal pulses. No carotid bruits.  Abdomen: no tenderness, no masses palpated. No hepatosplenomegaly. Bowel sounds positive.  Musculoskeletal: no clubbing / cyanosis. No joint deformity upper and lower extremities. Good ROM, no contractures. Normal muscle tone.  Skin: no rashes, lesions, ulcers. No induration Neurologic: CN 2-12 grossly intact. Sensation intact, DTR normal. Strength 5/5 in all 4.  Psychiatric: Normal judgment and insight. Alert and oriented x 3. Normal mood.    Labs on Admission: I have personally reviewed following labs and imaging studies  CBC: Recent Labs  Lab 02/02/20 1859  WBC 11.6*  NEUTROABS 7.5  HGB 14.7  HCT 42.1  MCV 83.7  PLT 318   Basic Metabolic Panel: Recent Labs  Lab  02/02/20 1859  NA 140  K 4.4  CL 102  CO2 25  GLUCOSE 105*  BUN 14  CREATININE 0.83  CALCIUM 10.3   GFR: Estimated Creatinine Clearance: 87.7 mL/min (by C-G formula based on SCr of 0.83 mg/dL). Liver Function Tests: Recent Labs  Lab 02/02/20 1859  AST 46*  ALT 59*  ALKPHOS 128*  BILITOT 0.6  PROT 8.6*  ALBUMIN 5.0   Recent Labs  Lab 02/02/20 1859  LIPASE 25   No results for input(s): AMMONIA in the last 168 hours. Coagulation Profile: No results for input(s): INR, PROTIME in the last 168 hours. Cardiac Enzymes: No results for input(s): CKTOTAL, CKMB, CKMBINDEX, TROPONINI in the last 168 hours. BNP (last 3 results) No results for input(s): PROBNP in the last 8760 hours. HbA1C: No results for input(s): HGBA1C in the last 72 hours. CBG: No results for input(s): GLUCAP in the last 168 hours. Lipid Profile: No results for input(s): CHOL, HDL, LDLCALC, TRIG, CHOLHDL, LDLDIRECT in the last 72 hours. Thyroid Function Tests: No results for input(s): TSH, T4TOTAL, FREET4, T3FREE, THYROIDAB in the last 72 hours. Anemia Panel: No results for input(s): VITAMINB12, FOLATE, FERRITIN, TIBC, IRON, RETICCTPCT in the last 72 hours. Urine analysis:    Component Value Date/Time   COLORURINE YELLOW 03/12/2018 0129   APPEARANCEUR HAZY (A) 03/12/2018 0129   LABSPEC 1.010 03/12/2018 0129   PHURINE 7.0 03/12/2018 0129   GLUCOSEU NEGATIVE 03/12/2018 0129   HGBUR SMALL (A) 03/12/2018 0129   BILIRUBINUR NEGATIVE 03/12/2018 0129   KETONESUR NEGATIVE 03/12/2018 0129   PROTEINUR >300 (A) 03/12/2018 0129   NITRITE POSITIVE (A) 03/12/2018 0129   LEUKOCYTESUR TRACE (A) 03/12/2018 0129    Radiological Exams on Admission: DG Chest Portable 1 View  Result Date: 02/02/2020 CLINICAL DATA:  Chest pressure, abdominal lower back pain with nausea EXAM: PORTABLE CHEST 1 VIEW COMPARISON:  None. FINDINGS: No consolidation, features of edema, pneumothorax, or effusion. Pulmonary vascularity is  normally distributed. The cardiomediastinal contours are unremarkable. No acute osseous or soft tissue abnormality. Telemetry leads overlie the chest. IMPRESSION: No acute cardiopulmonary abnormality. Electronically Signed   By: Kreg Shropshire M.D.   On: 02/02/2020 19:33   CT Angio Chest/Abd/Pel for Dissection W and/or Wo Contrast  Result Date: 02/02/2020 CLINICAL DATA:  49 year old female with abdominal pain. Concern for aortic dissection. EXAM: CT ANGIOGRAPHY CHEST, ABDOMEN AND PELVIS TECHNIQUE: Non-contrast CT of the chest was initially obtained. Multidetector CT imaging through the chest, abdomen and pelvis was performed using the standard protocol during bolus administration of intravenous contrast. Multiplanar reconstructed images and MIPs were obtained and reviewed to evaluate the vascular anatomy. CONTRAST:  OMNIPAQUE IOHEXOL 350 MG/ML SOLN COMPARISON:  CT abdomen pelvis dated 10/31/2010. FINDINGS: Evaluation is limited due to streak artifact caused by patient's arms and body habitus. CTA CHEST  FINDINGS Cardiovascular: There is no cardiomegaly or pericardial effusion. Minimal atherosclerotic calcification of the thoracic aorta. No aneurysmal dilatation or dissection. The origins of the great vessels of the aortic arch appear patent as visualized. Evaluation of the pulmonary arteries is limited due to suboptimal opacification and visualization of the peripheral branches. No large or central pulmonary artery embolus identified. Mediastinum/Nodes: There is no hilar or mediastinal adenopathy. Fluid noted within the esophagus which may represent reflux or delayed clearance. The thyroid gland is grossly unremarkable as visualized. No mediastinal fluid collection. Lungs/Pleura: The lungs are clear. There is no pleural effusion pneumothorax. The central airways are patent. Musculoskeletal: No chest wall abnormality. No acute or significant osseous findings. Review of the MIP images confirms the above  findings. CTA ABDOMEN AND PELVIS FINDINGS VASCULAR Aorta: Mild atherosclerotic calcification. No aneurysmal dilatation or dissection. No periaortic inflammation. Celiac: Patent without evidence of aneurysm, dissection, vasculitis or significant stenosis. SMA: Patent without evidence of aneurysm, dissection, vasculitis or significant stenosis. Renals: Both renal arteries are patent without evidence of aneurysm, dissection, vasculitis, fibromuscular dysplasia or significant stenosis. IMA: Patent without evidence of aneurysm, dissection, vasculitis or significant stenosis. Inflow: Patent without evidence of aneurysm, dissection, vasculitis or significant stenosis. Veins: No obvious venous abnormality within the limitations of this arterial phase study. Review of the MIP images confirms the above findings. NON-VASCULAR No intra-abdominal free air or free fluid. Hepatobiliary: Probable fatty infiltration of the liver. Evaluation of the liver is very limited due to streak artifact. No intrahepatic biliary ductal dilatation. Cholecystectomy. Pancreas: The pancreas is unremarkable as visualized. Spleen: Normal in size without focal abnormality. Adrenals/Urinary Tract: The adrenal glands unremarkable. There is no hydronephrosis on either side. The visualized ureters and urinary bladder appear unremarkable. Stomach/Bowel: The stomach is distended with fluid content. There is sigmoid diverticulosis without active inflammatory changes. Multiple top-normal caliber fluid-filled loops of proximal small bowel within the abdomen measure up to 2.6 cm in diameter. There is mild inflammatory changes and engorgement of the associated mesentery. The distal small bowel and terminal ileum demonstrate a normal caliber. Findings may represent enteritis with associated ileus or and early small-bowel obstruction. Clinical correlation and follow-up recommended. The colon is predominantly collapsed. Appendectomy. Lymphatic: No adenopathy.  Reproductive: The uterus is grossly unremarkable. No adnexal masses. Other: None Musculoskeletal: Degenerative changes primarily at L5-S1. No acute osseous pathology. Review of the MIP images confirms the above findings. IMPRESSION: 1. No aortic dissection or aneurysm. No CT evidence of central pulmonary artery embolus. 2. Findings may represent enteritis with associated ileus versus an early small-bowel obstruction. Clinical correlation is recommended. 3. Sigmoid diverticulosis. Electronically Signed   By: Elgie Collard M.D.   On: 02/02/2020 20:12    EKG: Independently reviewed. Q waves in Lead 3, otherwise unremarkable.  No priors available.  Assessment/Plan Principal Problem:   Abdominal pain Active Problems:   Enteritis   Diarrhea    1. Suspect enteritis as cause of abd pain and multiple episodes of diarrhea - 1. Doubt SBO given large volume stool output 2. Doubt significant ileus ileus given the same 3. None the less, will keep NPO for tonight to let her get some sleep 4. Has mild nausea, no vomiting, but with enteritis suspect that may change when we try POs later this morning 5. IVF: LR at 125 6. Repeat CBC and BMP in AM 7. Rocephin + flagyl 8. GI pathogen panel 9. Fentanyl PRN pain 2. HTN, depression - 1. Cont home meds  DVT prophylaxis: Lovenox Code Status: Full  Family Communication: No family in room Disposition Plan: Home after abd pain improved and taking POs Consults called: None Admission status: Place in obs    Bular Hickok M. DO [facility]  How to contact the [facility] Attending or Consulting provider 7A - 7P or covering provider during after hours 7P -7A, for this patient?  1. Check the care team in [facility] and look for a) attending/consulting [facility] provider listed and b) the [facility] team listed 2. Log into [email or web address]  Amion Physician Scheduling and messaging for groups and whole hospitals  On call and physician scheduling software for group practices,  residents, hospitalists and other medical providers for call, clinic, rotation and shift schedules. On[employer] is a hospital-wide system for scheduling doctors and paging doctors on call. EasyPlot is for scientific plotting and data analysis.  [email or web address]  and use [place]'s universal password to access. If you do not have the password, please contact the hospital operator.  3. Locate the [facility] provider you are looking for under [facility] and page to a number that you can be directly reached. 4. If you still have difficulty reaching the provider, please page the [facility] (Director on Call) for the Hospitalists listed on amion for assistance.  02/03/2020, 2:26 AM

## 2020-02-04 LAB — CBC
HCT: 35.3 % — ABNORMAL LOW (ref 36.0–46.0)
Hemoglobin: 11.9 g/dL — ABNORMAL LOW (ref 12.0–15.0)
MCH: 29 pg (ref 26.0–34.0)
MCHC: 33.7 g/dL (ref 30.0–36.0)
MCV: 86.1 fL (ref 80.0–100.0)
Platelets: 239 10*3/uL (ref 150–400)
RBC: 4.1 MIL/uL (ref 3.87–5.11)
RDW: 12.8 % (ref 11.5–15.5)
WBC: 7.9 10*3/uL (ref 4.0–10.5)
nRBC: 0 % (ref 0.0–0.2)

## 2020-02-04 LAB — GASTROINTESTINAL PANEL BY PCR, STOOL (REPLACES STOOL CULTURE)

## 2020-02-04 MED ORDER — TRAMADOL HCL 50 MG PO TABS
50.0000 mg | ORAL_TABLET | Freq: Four times a day (QID) | ORAL | Status: DC | PRN
  Administered 2020-02-04: 50 mg via ORAL
  Filled 2020-02-04: qty 1

## 2020-02-04 MED ORDER — VALACYCLOVIR HCL 1 G PO TABS: 1000.0000 mg | ORAL_TABLET | ORAL | Status: AC | PRN

## 2020-02-04 MED ORDER — CIPROFLOXACIN HCL 500 MG PO TABS: 500.0000 mg | ORAL_TABLET | Freq: Two times a day (BID) | ORAL | 0 refills | Status: AC

## 2020-02-04 MED ORDER — TRAMADOL HCL 50 MG PO TABS: 50.0000 mg | ORAL_TABLET | Freq: Four times a day (QID) | ORAL | 0 refills | Status: AC | PRN

## 2020-02-04 MED ORDER — METRONIDAZOLE 500 MG PO TABS: 500.0000 mg | ORAL_TABLET | Freq: Three times a day (TID) | ORAL | 0 refills | Status: AC

## 2020-02-04 NOTE — Discharge Summary (Signed)
Physician Discharge Summary  Alyssa Anthony FTD:[id number] DOB: [birth year] DOA: 02/02/2020  PCP: Wilburn Mylar, MD  Admit date: 02/02/2020 Discharge date: 02/04/2020  Admitted From: Home Disposition: Home  Recommendations for Outpatient Follow-up:  1. Follow up with PCP in 1 week  2. Follow up in ED if symptoms worsen or new appear   Home Health: No Equipment/Devices: None  Discharge Condition: Stable CODE STATUS: Full Diet recommendation: Heart healthy  Brief/Interim Summary: 49 year old female with history of hypertension and depression presented with abdominal pain along with diarrhea.  On presentation, CTA of chest/abdomen and pelvis showed no dissection, no central PE but showed enteritis with associated ileus versus early small bowel obstruction.  She was treated with IV fluids and antibiotics.  During hospitalization, her symptoms are improving.  Diarrhea is improving and diet has been advanced.  She has not had any diarrhea today.  She will be discharged home today on 5 days of oral ciprofloxacin and Flagyl.  Discharge Diagnoses:   Suspect enteritis as cause of abdominal pain and multiple episodes of diarrhea -CTA of chest/abdomen and pelvis showed no dissection, no central PE but showed enteritis with associated ileus versus early small bowel obstruction.  Doubt that patient has SBO as patient is still having diarrhea.  Diarrhea seems to be improving.  Stool GI PCR negative. -Symptoms are improving.   Tolerating diet.  Will advance diet to soft diet. -Currently on IV fluids and Rocephin and Flagyl -Hemodynamically stable - She has not had any diarrhea today. She will be discharged home today on 5 days of oral ciprofloxacin and Flagyl.  Hypertension -Blood pressure intermittently on the higher side.  Resume home regimen on discharge.  Depression -Resume home regimen on discharge.  Outpatient follow-up  Morbid obesity -Outpatient follow-up  Discharge  Instructions   Allergies as of 02/04/2020      Reactions   Prednisolone Other (See Comments)   'Make me want to kill people"   Codeine    Morphine And Related Hives   Other    Mycin drugs       Medication List    STOP taking these medications   cephALEXin 500 MG capsule Commonly known as: KEFLEX   fluconazole 150 MG tablet Commonly known as: Diflucan     TAKE these medications   amitriptyline 10 MG tablet Commonly known as: ELAVIL Take 10 mg by mouth at bedtime.   amLODipine 2.5 MG tablet Commonly known as: NORVASC Take by mouth.   ciprofloxacin 500 MG tablet Commonly known as: Cipro Take 1 tablet (500 mg total) by mouth 2 (two) times daily for 5 days.   escitalopram 10 MG tablet Commonly known as: LEXAPRO Take 10 mg by mouth daily.   meloxicam 15 MG tablet Commonly known as: MOBIC Take 15 mg by mouth daily.   metroNIDAZOLE 500 MG tablet Commonly known as: FLAGYL Take 1 tablet (500 mg total) by mouth 3 (three) times daily for 5 days.   omeprazole 20 MG capsule Commonly known as: PRILOSEC Take 20 mg by mouth daily.   traMADol 50 MG tablet Commonly known as: ULTRAM Take 1 tablet (50 mg total) by mouth every 6 (six) hours as needed for moderate pain.   valACYclovir 1000 MG tablet Commonly known as: VALTREX Take 1 tablet (1,000 mg total) by mouth as needed. What changed:   when to take this  reasons to take this        Follow-up Information    Wilburn Mylar, MD. Schedule an  appointment as soon as possible for a visit in 1 week(s).   Specialty: Family Medicine Contact information: [address] [place] [address]              Allergies  Allergen Reactions  . Prednisolone Other (See Comments)    'Make me want to kill people"   . Codeine   . Morphine And Related Hives  . Other     Mycin drugs     Consultations:  None   Procedures/Studies: DG Chest Portable 1 View  Result Date: 02/02/2020 CLINICAL DATA:   Chest pressure, abdominal lower back pain with nausea EXAM: PORTABLE CHEST 1 VIEW COMPARISON:  None. FINDINGS: No consolidation, features of edema, pneumothorax, or effusion. Pulmonary vascularity is normally distributed. The cardiomediastinal contours are unremarkable. No acute osseous or soft tissue abnormality. Telemetry leads overlie the chest. IMPRESSION: No acute cardiopulmonary abnormality. Electronically Signed   By: Kreg Shropshire M.D.   On: 02/02/2020 19:33   CT Angio Chest/Abd/Pel for Dissection W and/or Wo Contrast  Result Date: 02/02/2020 CLINICAL DATA:  49 year old female with abdominal pain. Concern for aortic dissection. EXAM: CT ANGIOGRAPHY CHEST, ABDOMEN AND PELVIS TECHNIQUE: Non-contrast CT of the chest was initially obtained. Multidetector CT imaging through the chest, abdomen and pelvis was performed using the standard protocol during bolus administration of intravenous contrast. Multiplanar reconstructed images and MIPs were obtained and reviewed to evaluate the vascular anatomy. CONTRAST:  OMNIPAQUE IOHEXOL 350 MG/ML SOLN COMPARISON:  CT abdomen pelvis dated 10/31/2010. FINDINGS: Evaluation is limited due to streak artifact caused by patient's arms and body habitus. CTA CHEST FINDINGS Cardiovascular: There is no cardiomegaly or pericardial effusion. Minimal atherosclerotic calcification of the thoracic aorta. No aneurysmal dilatation or dissection. The origins of the great vessels of the aortic arch appear patent as visualized. Evaluation of the pulmonary arteries is limited due to suboptimal opacification and visualization of the peripheral branches. No large or central pulmonary artery embolus identified. Mediastinum/Nodes: There is no hilar or mediastinal adenopathy. Fluid noted within the esophagus which may represent reflux or delayed clearance. The thyroid gland is grossly unremarkable as visualized. No mediastinal fluid collection. Lungs/Pleura: The lungs are clear. There is no  pleural effusion pneumothorax. The central airways are patent. Musculoskeletal: No chest wall abnormality. No acute or significant osseous findings. Review of the MIP images confirms the above findings. CTA ABDOMEN AND PELVIS FINDINGS VASCULAR Aorta: Mild atherosclerotic calcification. No aneurysmal dilatation or dissection. No periaortic inflammation. Celiac: Patent without evidence of aneurysm, dissection, vasculitis or significant stenosis. SMA: Patent without evidence of aneurysm, dissection, vasculitis or significant stenosis. Renals: Both renal arteries are patent without evidence of aneurysm, dissection, vasculitis, fibromuscular dysplasia or significant stenosis. IMA: Patent without evidence of aneurysm, dissection, vasculitis or significant stenosis. Inflow: Patent without evidence of aneurysm, dissection, vasculitis or significant stenosis. Veins: No obvious venous abnormality within the limitations of this arterial phase study. Review of the MIP images confirms the above findings. NON-VASCULAR No intra-abdominal free air or free fluid. Hepatobiliary: Probable fatty infiltration of the liver. Evaluation of the liver is very limited due to streak artifact. No intrahepatic biliary ductal dilatation. Cholecystectomy. Pancreas: The pancreas is unremarkable as visualized. Spleen: Normal in size without focal abnormality. Adrenals/Urinary Tract: The adrenal glands unremarkable. There is no hydronephrosis on either side. The visualized ureters and urinary bladder appear unremarkable. Stomach/Bowel: The stomach is distended with fluid content. There is sigmoid diverticulosis without active inflammatory changes. Multiple top-normal caliber fluid-filled loops of proximal small bowel  within the abdomen measure up to 2.6 cm in diameter. There is mild inflammatory changes and engorgement of the associated mesentery. The distal small bowel and terminal ileum demonstrate a normal caliber. Findings may represent enteritis  with associated ileus or and early small-bowel obstruction. Clinical correlation and follow-up recommended. The colon is predominantly collapsed. Appendectomy. Lymphatic: No adenopathy. Reproductive: The uterus is grossly unremarkable. No adnexal masses. Other: None Musculoskeletal: Degenerative changes primarily at L5-S1. No acute osseous pathology. Review of the MIP images confirms the above findings. IMPRESSION: 1. No aortic dissection or aneurysm. No CT evidence of central pulmonary artery embolus. 2. Findings may represent enteritis with associated ileus versus an early small-bowel obstruction. Clinical correlation is recommended. 3. Sigmoid diverticulosis. Electronically Signed   By: Elgie CollardArash  Radparvar M.D.   On: 02/02/2020 20:12       Subjective: Patient seen and examined at bedside.  States that she is feeling much better although she still having some loose watery bowel movements but much improved since presentation.  Still intermittently has abdominal pain.  No nausea or vomiting.  Feels that she might be able to go home if she tolerates solid food today.  Discharge Exam: Vitals:   02/03/20 2138 02/04/20 0624  BP: (!) 145/80 136/86  Pulse: 94 60  Resp: 17 15  Temp: 98.8 F (37.1 C) 98 F (36.7 C)  SpO2: 94% 100%    General: Pt is alert, awake, not in acute distress Cardiovascular: rate controlled, S1/S2 + Respiratory: bilateral decreased breath sounds at bases Abdominal: Soft, morbidly obese, mildly tender in the periumbilical region, ND, bowel sounds + Extremities: no edema, no cyanosis    The results of significant diagnostics from this hospitalization (including imaging, microbiology, ancillary and laboratory) are listed below for reference.     Microbiology: Recent Results (from the past 240 hour(s))  Resp Panel by RT-PCR (Flu A&B, Covid) Nasopharyngeal Swab     Status: None   Collection Time: 02/02/20  7:29 PM   Specimen: Nasopharyngeal Swab; Nasopharyngeal(NP) swabs  in vial transport medium  Result Value Ref Range Status   SARS Coronavirus 2 by RT PCR NEGATIVE NEGATIVE Final    Comment: (NOTE) SARS-CoV-2 target nucleic acids are NOT DETECTED.  The SARS-CoV-2 RNA is generally detectable in upper respiratory specimens during the acute phase of infection. The lowest concentration of SARS-CoV-2 viral copies this assay can detect is 138 copies/mL. A negative result does not preclude SARS-Cov-2 infection and should not be used as the sole basis for treatment or other patient management decisions. A negative result may occur with  improper specimen collection/handling, submission of specimen other than nasopharyngeal swab, presence of viral mutation(s) within the areas targeted by this assay, and inadequate number of viral copies(<138 copies/mL). A negative result must be combined with clinical observations, patient history, and epidemiological information. The expected result is Negative.  Fact Sheet for Patients:  BloggerCourse.comhttps://www.fda.gov/media/152166/download  Fact Sheet for Healthcare Providers:  SeriousBroker.ithttps://www.fda.gov/media/152162/download  This test is no t yet approved or cleared by the [place] FDA and  has been authorized for detection and/or diagnosis of SARS-CoV-2 by FDA under an Emergency Use Authorization (EUA). This EUA will remain  in effect (meaning this test can be used) for the duration of the COVID-19 declaration under Section 564(b)(1) of the Act, 21 U.S.C.section 360bbb-3(b)(1), unless the authorization is terminated  or revoked sooner.       Influenza A by PCR NEGATIVE NEGATIVE Final   Influenza B by PCR NEGATIVE NEGATIVE Final  Comment: (NOTE) The Xpert Xpress SARS-CoV-2/FLU/RSV plus assay is intended as an aid in the diagnosis of influenza from Nasopharyngeal swab specimens and should not be used as a sole basis for treatment. Nasal washings and aspirates are unacceptable for Xpert Xpress  SARS-CoV-2/FLU/RSV testing.  Fact Sheet for Patients: BloggerCourse.com  Fact Sheet for Healthcare Providers: SeriousBroker.it  This test is not yet approved or cleared by the [place] FDA and has been authorized for detection and/or diagnosis of SARS-CoV-2 by FDA under an Emergency Use Authorization (EUA). This EUA will remain in effect (meaning this test can be used) for the duration of the COVID-19 declaration under Section 564(b)(1) of the Act, 21 U.S.C. section 360bbb-3(b)(1), unless the authorization is terminated or revoked.  Performed at [facility], [address] [place], [place] [address]   Gastrointestinal Panel by PCR , Stool     Status: None   Collection Time: 02/03/20  8:17 AM   Specimen: Stool  Result Value Ref Range Status   Campylobacter species NOT DETECTED NOT DETECTED Final   Plesimonas shigelloides NOT DETECTED NOT DETECTED Final   Salmonella species NOT DETECTED NOT DETECTED Final   Yersinia enterocolitica NOT DETECTED NOT DETECTED Final   Vibrio species NOT DETECTED NOT DETECTED Final   Vibrio cholerae NOT DETECTED NOT DETECTED Final   Enteroaggregative E coli (EAEC) NOT DETECTED NOT DETECTED Final   Enteropathogenic E coli (EPEC) NOT DETECTED NOT DETECTED Final   Enterotoxigenic E coli (ETEC) NOT DETECTED NOT DETECTED Final   Shiga like toxin producing E coli (STEC) NOT DETECTED NOT DETECTED Final   Shigella/Enteroinvasive E coli (EIEC) NOT DETECTED NOT DETECTED Final   Cryptosporidium NOT DETECTED NOT DETECTED Final   Cyclospora cayetanensis NOT DETECTED NOT DETECTED Final   Entamoeba histolytica NOT DETECTED NOT DETECTED Final   Giardia lamblia NOT DETECTED NOT DETECTED Final   Adenovirus F40/41 NOT DETECTED NOT DETECTED Final   Astrovirus NOT DETECTED NOT DETECTED Final   Norovirus GI/GII NOT DETECTED NOT DETECTED Final   Rotavirus A NOT DETECTED NOT DETECTED Final   Sapovirus (I,  II, IV, and V) NOT DETECTED NOT DETECTED Final    Comment: Performed at [facility], [address] [place], [place] [address]     Labs: BNP (last 3 results) No results for input(s): BNP in the last 8760 hours. Basic Metabolic Panel: Recent Labs  Lab 02/02/20 1859 02/03/20 0613  NA 140 140  K 4.4 4.1  CL 102 105  CO2 25 23  GLUCOSE 105* 132*  BUN 14 10  CREATININE 0.83 0.71  CALCIUM 10.3 9.2   Liver Function Tests: Recent Labs  Lab 02/02/20 1859  AST 46*  ALT 59*  ALKPHOS 128*  BILITOT 0.6  PROT 8.6*  ALBUMIN 5.0   Recent Labs  Lab 02/02/20 1859  LIPASE 25   No results for input(s): AMMONIA in the last 168 hours. CBC: Recent Labs  Lab 02/02/20 1859 02/03/20 0613 02/04/20 0532  WBC 11.6* 14.2* 7.9  NEUTROABS 7.5  --   --   HGB 14.7 13.0 11.9*  HCT 42.1 38.1 35.3*  MCV 83.7 85.8 86.1  PLT 318 283 239   Cardiac Enzymes: No results for input(s): CKTOTAL, CKMB, CKMBINDEX, TROPONINI in the last 168 hours. BNP: Invalid input(s): POCBNP CBG: No results for input(s): GLUCAP in the last 168 hours. D-Dimer No results for input(s): DDIMER in the last 72 hours. Hgb A1c No results for input(s): HGBA1C in the last 72 hours. Lipid  Profile No results for input(s): CHOL, HDL, LDLCALC, TRIG, CHOLHDL, LDLDIRECT in the last 72 hours. Thyroid function studies No results for input(s): TSH, T4TOTAL, T3FREE, THYROIDAB in the last 72 hours.  Invalid input(s): FREET3 Anemia work up No results for input(s): VITAMINB12, FOLATE, FERRITIN, TIBC, IRON, RETICCTPCT in the last 72 hours. Urinalysis    Component Value Date/Time   COLORURINE YELLOW 03/12/2018 0129   APPEARANCEUR HAZY (A) 03/12/2018 0129   LABSPEC 1.010 03/12/2018 0129   PHURINE 7.0 03/12/2018 0129   GLUCOSEU NEGATIVE 03/12/2018 0129   HGBUR SMALL (A) 03/12/2018 0129   BILIRUBINUR NEGATIVE 03/12/2018 0129   KETONESUR NEGATIVE 03/12/2018 0129   PROTEINUR >300 (A) 03/12/2018 0129   NITRITE  POSITIVE (A) 03/12/2018 0129   LEUKOCYTESUR TRACE (A) 03/12/2018 0129   Sepsis Labs Invalid input(s): PROCALCITONIN,  WBC,  LACTICIDVEN Microbiology Recent Results (from the past 240 hour(s))  Resp Panel by RT-PCR (Flu A&B, Covid) Nasopharyngeal Swab     Status: None   Collection Time: 02/02/20  7:29 PM   Specimen: Nasopharyngeal Swab; Nasopharyngeal(NP) swabs in vial transport medium  Result Value Ref Range Status   SARS Coronavirus 2 by RT PCR NEGATIVE NEGATIVE Final    Comment: (NOTE) SARS-CoV-2 target nucleic acids are NOT DETECTED.  The SARS-CoV-2 RNA is generally detectable in upper respiratory specimens during the acute phase of infection. The lowest concentration of SARS-CoV-2 viral copies this assay can detect is 138 copies/mL. A negative result does not preclude SARS-Cov-2 infection and should not be used as the sole basis for treatment or other patient management decisions. A negative result may occur with  improper specimen collection/handling, submission of specimen other than nasopharyngeal swab, presence of viral mutation(s) within the areas targeted by this assay, and inadequate number of viral copies(<138 copies/mL). A negative result must be combined with clinical observations, patient history, and epidemiological information. The expected result is Negative.  Fact Sheet for Patients:  BloggerCourse.com  Fact Sheet for Healthcare Providers:  SeriousBroker.it  This test is no t yet approved or cleared by the [place] FDA and  has been authorized for detection and/or diagnosis of SARS-CoV-2 by FDA under an Emergency Use Authorization (EUA). This EUA will remain  in effect (meaning this test can be used) for the duration of the COVID-19 declaration under Section 564(b)(1) of the Act, 21 U.S.C.section 360bbb-3(b)(1), unless the authorization is terminated  or revoked sooner.       Influenza A by PCR  NEGATIVE NEGATIVE Final   Influenza B by PCR NEGATIVE NEGATIVE Final    Comment: (NOTE) The Xpert Xpress SARS-CoV-2/FLU/RSV plus assay is intended as an aid in the diagnosis of influenza from Nasopharyngeal swab specimens and should not be used as a sole basis for treatment. Nasal washings and aspirates are unacceptable for Xpert Xpress SARS-CoV-2/FLU/RSV testing.  Fact Sheet for Patients: BloggerCourse.com  Fact Sheet for Healthcare Providers: SeriousBroker.it  This test is not yet approved or cleared by the [place] FDA and has been authorized for detection and/or diagnosis of SARS-CoV-2 by FDA under an Emergency Use Authorization (EUA). This EUA will remain in effect (meaning this test can be used) for the duration of the COVID-19 declaration under Section 564(b)(1) of the Act, 21 U.S.C. section 360bbb-3(b)(1), unless the authorization is terminated or revoked.  Performed at [facility], [address] [place], [place] [address]   Gastrointestinal Panel by PCR , Stool     Status: None   Collection Time: 02/03/20  8:17 AM  Specimen: Stool  Result Value Ref Range Status   Campylobacter species NOT DETECTED NOT DETECTED Final   Plesimonas shigelloides NOT DETECTED NOT DETECTED Final   Salmonella species NOT DETECTED NOT DETECTED Final   Yersinia enterocolitica NOT DETECTED NOT DETECTED Final   Vibrio species NOT DETECTED NOT DETECTED Final   Vibrio cholerae NOT DETECTED NOT DETECTED Final   Enteroaggregative E coli (EAEC) NOT DETECTED NOT DETECTED Final   Enteropathogenic E coli (EPEC) NOT DETECTED NOT DETECTED Final   Enterotoxigenic E coli (ETEC) NOT DETECTED NOT DETECTED Final   Shiga like toxin producing E coli (STEC) NOT DETECTED NOT DETECTED Final   Shigella/Enteroinvasive E coli (EIEC) NOT DETECTED NOT DETECTED Final   Cryptosporidium NOT DETECTED NOT DETECTED Final   Cyclospora cayetanensis NOT  DETECTED NOT DETECTED Final   Entamoeba histolytica NOT DETECTED NOT DETECTED Final   Giardia lamblia NOT DETECTED NOT DETECTED Final   Adenovirus F40/41 NOT DETECTED NOT DETECTED Final   Astrovirus NOT DETECTED NOT DETECTED Final   Norovirus GI/GII NOT DETECTED NOT DETECTED Final   Rotavirus A NOT DETECTED NOT DETECTED Final   Sapovirus (I, II, IV, and V) NOT DETECTED NOT DETECTED Final    Comment: Performed at [facility], [address] [place], [place] [address]     Time coordinating discharge: 35 minutes  SIGNED:   Glade Lloyd, MD  [facility] 02/04/2020, 1:04 PM

## 2020-02-04 NOTE — Plan of Care (Signed)
Instructions were reviewed with patient. All questions were answered. Patient was transported to main entrance by wheelchair. ° °

## 2020-02-10 ENCOUNTER — Encounter (HOSPITAL_BASED_OUTPATIENT_CLINIC_OR_DEPARTMENT_OTHER): Payer: Self-pay

## 2020-02-10 ENCOUNTER — Other Ambulatory Visit: Payer: Self-pay

## 2020-02-10 ENCOUNTER — Emergency Department (HOSPITAL_BASED_OUTPATIENT_CLINIC_OR_DEPARTMENT_OTHER)
Admission: EM | Admit: 2020-02-10 | Discharge: 2020-02-10 | Disposition: A | Discharge status: Home / Self Care | Payer: No Typology Code available for payment source | Attending: Emergency Medicine | Admitting: Emergency Medicine

## 2020-02-10 DIAGNOSIS — R1030 Lower abdominal pain, unspecified: Secondary | ICD-10-CM | POA: Diagnosis not present

## 2020-02-10 DIAGNOSIS — Z5321 Procedure and treatment not carried out due to patient leaving prior to being seen by health care provider: Secondary | ICD-10-CM | POA: Diagnosis not present

## 2020-02-10 DIAGNOSIS — R1010 Upper abdominal pain, unspecified: Secondary | ICD-10-CM | POA: Insufficient documentation

## 2020-02-10 LAB — CBC
HCT: 39.7 % (ref 36.0–46.0)
Hemoglobin: 13.6 g/dL (ref 12.0–15.0)
MCH: 29.1 pg (ref 26.0–34.0)
MCHC: 34.3 g/dL (ref 30.0–36.0)
MCV: 84.8 fL (ref 80.0–100.0)
Platelets: 351 10*3/uL (ref 150–400)
RBC: 4.68 MIL/uL (ref 3.87–5.11)
RDW: 13 % (ref 11.5–15.5)
WBC: 9.8 10*3/uL (ref 4.0–10.5)
nRBC: 0 % (ref 0.0–0.2)

## 2020-02-10 LAB — LIPASE, BLOOD: Lipase: 26 U/L (ref 11–51)

## 2020-02-10 LAB — URINALYSIS, ROUTINE W REFLEX MICROSCOPIC
Bilirubin Urine: NEGATIVE
Glucose, UA: NEGATIVE mg/dL
Ketones, ur: NEGATIVE mg/dL
Leukocytes,Ua: NEGATIVE
Nitrite: NEGATIVE
Protein, ur: NEGATIVE mg/dL
Specific Gravity, Urine: 1.01 (ref 1.005–1.030)
pH: 6.5 (ref 5.0–8.0)

## 2020-02-10 LAB — URINALYSIS, MICROSCOPIC (REFLEX)

## 2020-02-10 LAB — COMPREHENSIVE METABOLIC PANEL
ALT: 75 U/L — ABNORMAL HIGH (ref 0–44)
AST: 42 U/L — ABNORMAL HIGH (ref 15–41)
Albumin: 4.4 g/dL (ref 3.5–5.0)
Alkaline Phosphatase: 110 U/L (ref 38–126)
Anion gap: 9 (ref 5–15)
BUN: 10 mg/dL (ref 6–20)
CO2: 28 mmol/L (ref 22–32)
Calcium: 9.6 mg/dL (ref 8.9–10.3)
Chloride: 101 mmol/L (ref 98–111)
Creatinine, Ser: 0.77 mg/dL (ref 0.44–1.00)
GFR, Estimated: >60 mL/min (ref >60)
Glucose, Bld: 114 mg/dL — ABNORMAL HIGH (ref 70–99)
Potassium: 3.6 mmol/L (ref 3.5–5.1)
Sodium: 138 mmol/L (ref 135–145)
Total Bilirubin: 0.2 mg/dL — ABNORMAL LOW (ref 0.3–1.2)
Total Protein: 7.4 g/dL (ref 6.5–8.1)

## 2020-02-10 LAB — PREGNANCY, URINE: Preg Test, Ur: NEGATIVE

## 2020-02-10 NOTE — ED Triage Notes (Signed)
Pt c/o upper and lower abd pain that radiates to back x this am-states she had recent admn for same c/o-states she completed d/c abx-NAD-steady gait

## 2021-03-24 ENCOUNTER — Telehealth (HOSPITAL_BASED_OUTPATIENT_CLINIC_OR_DEPARTMENT_OTHER): Payer: Self-pay | Admitting: *Deleted

## 2021-03-24 NOTE — Telephone Encounter (Signed)
Pt calling for results of Ultrasound that she had yesterday 03/23/21.  Please advise results. Thanks Sherrilyn Rist CMA

## 2021-03-30 ENCOUNTER — Emergency Department (HOSPITAL_BASED_OUTPATIENT_CLINIC_OR_DEPARTMENT_OTHER)
Admission: EM | Admit: 2021-03-30 | Discharge: 2021-03-30 | Disposition: A | Discharge status: Home / Self Care | Payer: No Typology Code available for payment source | Attending: Emergency Medicine | Admitting: Emergency Medicine

## 2021-03-30 ENCOUNTER — Other Ambulatory Visit: Payer: Self-pay

## 2021-03-30 ENCOUNTER — Emergency Department (HOSPITAL_BASED_OUTPATIENT_CLINIC_OR_DEPARTMENT_OTHER): Payer: No Typology Code available for payment source

## 2021-03-30 ENCOUNTER — Encounter (HOSPITAL_BASED_OUTPATIENT_CLINIC_OR_DEPARTMENT_OTHER): Payer: Self-pay

## 2021-03-30 DIAGNOSIS — Z79899 Other long term (current) drug therapy: Secondary | ICD-10-CM | POA: Insufficient documentation

## 2021-03-30 DIAGNOSIS — I10 Essential (primary) hypertension: Secondary | ICD-10-CM | POA: Diagnosis not present

## 2021-03-30 DIAGNOSIS — R197 Diarrhea, unspecified: Secondary | ICD-10-CM | POA: Insufficient documentation

## 2021-03-30 DIAGNOSIS — R079 Chest pain, unspecified: Secondary | ICD-10-CM | POA: Diagnosis present

## 2021-03-30 DIAGNOSIS — R110 Nausea: Secondary | ICD-10-CM | POA: Diagnosis not present

## 2021-03-30 LAB — BASIC METABOLIC PANEL
Anion gap: 12 (ref 5–15)
BUN: 18 mg/dL (ref 6–20)
CO2: 25 mmol/L (ref 22–32)
Calcium: 9.5 mg/dL (ref 8.9–10.3)
Chloride: 101 mmol/L (ref 98–111)
Creatinine, Ser: 0.83 mg/dL (ref 0.44–1.00)
GFR, Estimated: >60 mL/min (ref >60)
Glucose, Bld: 135 mg/dL — ABNORMAL HIGH (ref 70–99)
Potassium: 3.5 mmol/L (ref 3.5–5.1)
Sodium: 138 mmol/L (ref 135–145)

## 2021-03-30 LAB — CBC
HCT: 43.1 % (ref 36.0–46.0)
Hemoglobin: 14.8 g/dL (ref 12.0–15.0)
MCH: 28.7 pg (ref 26.0–34.0)
MCHC: 34.3 g/dL (ref 30.0–36.0)
MCV: 83.5 fL (ref 80.0–100.0)
Platelets: 347 10*3/uL (ref 150–400)
RBC: 5.16 MIL/uL — ABNORMAL HIGH (ref 3.87–5.11)
RDW: 13.1 % (ref 11.5–15.5)
WBC: 13.5 10*3/uL — ABNORMAL HIGH (ref 4.0–10.5)
nRBC: 0 % (ref 0.0–0.2)

## 2021-03-30 LAB — TROPONIN I (HIGH SENSITIVITY)
Troponin I (High Sensitivity): 2 ng/L (ref <18)
Troponin I (High Sensitivity): 2 ng/L (ref <18)

## 2021-03-30 MED ORDER — PANTOPRAZOLE SODIUM 20 MG PO TBEC: 40.0000 mg | DELAYED_RELEASE_TABLET | Freq: Every day | ORAL | 0 refills | Status: AC

## 2021-03-30 MED ORDER — ONDANSETRON 4 MG PO TBDP
8.0000 mg | ORAL_TABLET | Freq: Once | ORAL | Status: AC
  Administered 2021-03-30: 14:00:00 8 mg via ORAL
  Filled 2021-03-30: qty 2

## 2021-03-30 MED ORDER — FENTANYL CITRATE PF 50 MCG/ML IJ SOSY
50.0000 ug | PREFILLED_SYRINGE | Freq: Once | INTRAMUSCULAR | Status: AC
  Administered 2021-03-30: 16:00:00 50 ug via INTRAVENOUS
  Filled 2021-03-30: qty 1

## 2021-03-30 MED ORDER — ONDANSETRON 4 MG PO TBDP: 4.0000 mg | ORAL_TABLET | Freq: Three times a day (TID) | ORAL | 0 refills | Status: AC | PRN

## 2021-03-30 MED ORDER — ASPIRIN 325 MG PO TABS
325.0000 mg | ORAL_TABLET | Freq: Every day | ORAL | Status: DC
  Administered 2021-03-30: 14:00:00 325 mg via ORAL
  Filled 2021-03-30: qty 1

## 2021-03-30 NOTE — ED Notes (Signed)
Pt transported to radiology.

## 2021-03-30 NOTE — ED Notes (Signed)
ED Provider at bedside. 

## 2021-03-30 NOTE — ED Provider Notes (Signed)
[facility] Provider Note   CSN: [id number] Arrival date & time: 03/30/21  1344     History  Chief Complaint  Patient presents with   Chest Pain    Alyssa Anthony is a 51 y.o. female.   Chest Pain  Patient with history of hypertension on amlodipine and hyperlipemia presents with chest pain.  The chest pain started acutely 1 hour ago, its been constant.  Severity has not significantly changed, leaning forward seems to worsen the pain.  It is centered in her chest, some pain in the back not sure if it is related.  Associated with nausea but no vomiting.  Denies feeling short of breath, no recent travel or surgeries.  No history of MI or PEs.  Ex-smoker, has not used cigarettes in many years.   S/P appendectomy and cholecystectomy.  PMH: HTN, HLD, depression, GERD  Home Medications Prior to Admission medications   Medication Sig Start Date End Date Taking? Authorizing Provider  ondansetron (ZOFRAN-ODT) 4 MG disintegrating tablet Take 1 tablet (4 mg total) by mouth every 8 (eight) hours as needed for nausea or vomiting. 03/30/21  Yes Gareth Morgan, MD  pantoprazole (PROTONIX) 20 MG tablet Take 2 tablets (40 mg total) by mouth daily for 7 days. 03/30/21 04/06/21 Yes Gareth Morgan, MD  amitriptyline (ELAVIL) 10 MG tablet Take 10 mg by mouth at bedtime.    [provider]  amLODipine (NORVASC) 2.5 MG tablet Take by mouth. 08/25/19 08/24/20  [provider]  escitalopram (LEXAPRO) 10 MG tablet Take 10 mg by mouth daily.    [provider]  meloxicam (MOBIC) 15 MG tablet Take 15 mg by mouth daily.    [provider]  traMADol (ULTRAM) 50 MG tablet Take 1 tablet (50 mg total) by mouth every 6 (six) hours as needed for moderate pain. 02/04/20   Aline August, MD  valACYclovir (VALTREX) 1000 MG tablet Take 1 tablet (1,000 mg total) by mouth as needed. 02/04/20   Aline August, MD      Allergies    Prednisolone,  Codeine, Morphine and related, and Other    Review of Systems   Review of Systems  Cardiovascular:  Positive for chest pain.   Physical Exam Updated Vital Signs BP (!) 153/80    Pulse 88    Temp (!) 97.1 F (36.2 C) (Oral)    Resp 15    Ht 5\' 1"  (1.549 m)    Wt 97.5 kg    SpO2 99%    BMI 40.62 kg/m  Physical Exam Vitals and nursing note reviewed. Exam conducted with a chaperone present.  Constitutional:      Appearance: Normal appearance.     Comments: Appears uncomfortable but not in acute distress  HENT:     Head: Normocephalic and atraumatic.  Eyes:     General: No scleral icterus.       Right eye: No discharge.        Left eye: No discharge.     Extraocular Movements: Extraocular movements intact.     Pupils: Pupils are equal, round, and reactive to light.  Cardiovascular:     Rate and Rhythm: Normal rate and regular rhythm.     Pulses: Normal pulses.     Heart sounds: Normal heart sounds. No murmur heard.   No friction rub. No gallop.     Comments: Radial pulse 2+ bilat Pulmonary:     Effort: Pulmonary effort is normal. No respiratory distress.  Breath sounds: Normal breath sounds.  Chest:     Chest wall: Tenderness present.  Abdominal:     General: Abdomen is flat. Bowel sounds are normal. There is no distension.     Palpations: Abdomen is soft.     Tenderness: There is no abdominal tenderness.  Musculoskeletal:     Right lower leg: No edema.  Skin:    General: Skin is warm.     Coloration: Skin is not jaundiced.  Neurological:     Mental Status: She is alert. Mental status is at baseline.     Coordination: Coordination normal.    ED Results / Procedures / Treatments   Labs (all labs ordered are listed, but only abnormal results are displayed) Labs Reviewed  BASIC METABOLIC PANEL - Abnormal; Notable for the following components:      Result Value   Glucose, Bld 135 (*)    All other components within normal limits  CBC - Abnormal; Notable for the  following components:   WBC 13.5 (*)    RBC 5.16 (*)    All other components within normal limits  TROPONIN I (HIGH SENSITIVITY)  TROPONIN I (HIGH SENSITIVITY)    EKG EKG Interpretation  Date/Time:  Wednesday March 30 2021 14:37:20 EST Ventricular Rate:  102 PR Interval:  153 QRS Duration: 92 QT Interval:  358 QTC Calculation: 467 R Axis:   19 Text Interpretation: Sinus tachycardia Low voltage, precordial leads normal axis compared to prior Flipped t waves in I and avl now upright Otherwise no significant change Confirmed by Deno Etienne [phone]) on 03/30/2021 2:40:01 PM  Radiology DG Chest 2 View  Result Date: 03/30/2021 CLINICAL DATA:  Mid chest pain with nausea and diaphoresis. History of hypertension. EXAM: CHEST - 2 VIEW COMPARISON:  Radiographs 02/02/2020.  CT 02/02/2020. FINDINGS: 1427 hours. The heart size and mediastinal contours are normal. The lungs are clear. There is no pleural effusion or pneumothorax. No acute osseous findings are identified. There are mild degenerative changes in the spine. Cholecystectomy clips are noted. IMPRESSION: Stable chest.  No evidence of active cardiopulmonary process. Electronically Signed   By: Richardean Sale M.D.   On: 03/30/2021 14:40    Procedures Procedures    Medications Ordered in ED Medications  aspirin tablet 325 mg (325 mg Oral Given 03/30/21 1408)  ondansetron (ZOFRAN-ODT) disintegrating tablet 8 mg (8 mg Oral Given 03/30/21 1409)  fentaNYL (SUBLIMAZE) injection 50 mcg (50 mcg Intravenous Given 03/30/21 1532)    ED Course/ Medical Decision Making/ A&P    This patient presents to the ED for concern of chest pain, this involves an extensive number of treatment options, and is a complaint that carries with it a high risk of complications and morbidity.  The differential diagnosis includes ACS, PE, dissection, esophageal perforation, PE, PTX, myocarditis, pericarditis, GERD< PNA, GERD, costochondritis vs other   Heart score  5.  Additional history obtained: -External records from outside source obtained and reviewed including: Chart review including previous notes, labs, imaging, consultation notes   Lab Tests: -I ordered, reviewed, and interpreted labs.  The pertinent results include: Mild leukocytosis at 3.5, no gross electrolyte derangement.  Negative delta Trope   EKG -Sinus tachycardia, no ischemic ST changes.    Imaging Studies ordered: -I ordered imaging studies including chest x-ray -I independently visualized and interpreted imaging which showed stable cardiac silhouette without acute process -I agree with the radiologist interpretation   Medicines ordered and prescription drug management: -I ordered medication including Zofran, aspirin, fentanyl  for pain and nausea -Reevaluation of the patient after these medicines showed that the patient improved -I have reviewed the patients home medicines and have made adjustments as needed   ED Course: Vitals are stable although she was initially tachycardic.  She is not hypoxic so I do not have a high suspicion this is a PE.  Delta troponin negative, no ischemic findings on the EKG so not fully consistent with an ACS presentation.  Chest x-ray does not show any evidence of pneumonia, widened mediastinum concerning for dissection, PTX.  Additionally her radial pulses are equal bilaterally and the chest pain does not appear to be consistent with an acute dissection.  She became asymptomatic after given medicine for pain and nausea.   We engaged in shared decision-making, patient does not desire admission for observation.  Her heart score is 5 but she does not have a significant cardiac history.  I do agree it is reasonable to have her follow-up with her PCP and with cardiology for additional reevaluation and to return if things change or worsen.  Patient is in agreement.  We discussed return precautions and she was discharged in stable condition.   Cardiac  Monitoring: The patient was maintained on a cardiac monitor.  I personally viewed and interpreted the cardiac monitored which showed an underlying rhythm of: NSR   Reevaluation: After the interventions noted above, I reevaluated the patient and found that they have :resolved   Dispostion: Cardiology follow up. Zofran and pepcid. Discharge          Final Clinical Impression(s) / ED Diagnoses Final diagnoses:  Nonspecific chest pain  Diarrhea of presumed infectious origin  Nausea    Rx / DC Orders ED Discharge Orders          Ordered    ondansetron (ZOFRAN-ODT) 4 MG disintegrating tablet  Every 8 hours PRN        03/30/21 1703    pantoprazole (PROTONIX) 20 MG tablet  Daily        03/30/21 1703              Sherrill Raring, PA-C 03/30/21 [place], [place], DO 04/02/21 [phone]

## 2021-03-30 NOTE — ED Triage Notes (Addendum)
Pt c/o CP x 1 hour-denies fever/flu sx at present-states she did feel like she "had a cold about a week ago"-pt with nausea/diaphoretic

## 2021-11-11 IMAGING — DX DG CHEST 1V PORT
1 series · 1 of 1 positions shown · non-contrast
Comparison: None.

CLINICAL DATA: Chest pressure, abdominal lower back pain with
nausea

EXAM:
PORTABLE CHEST 1 VIEW

[chest ap]
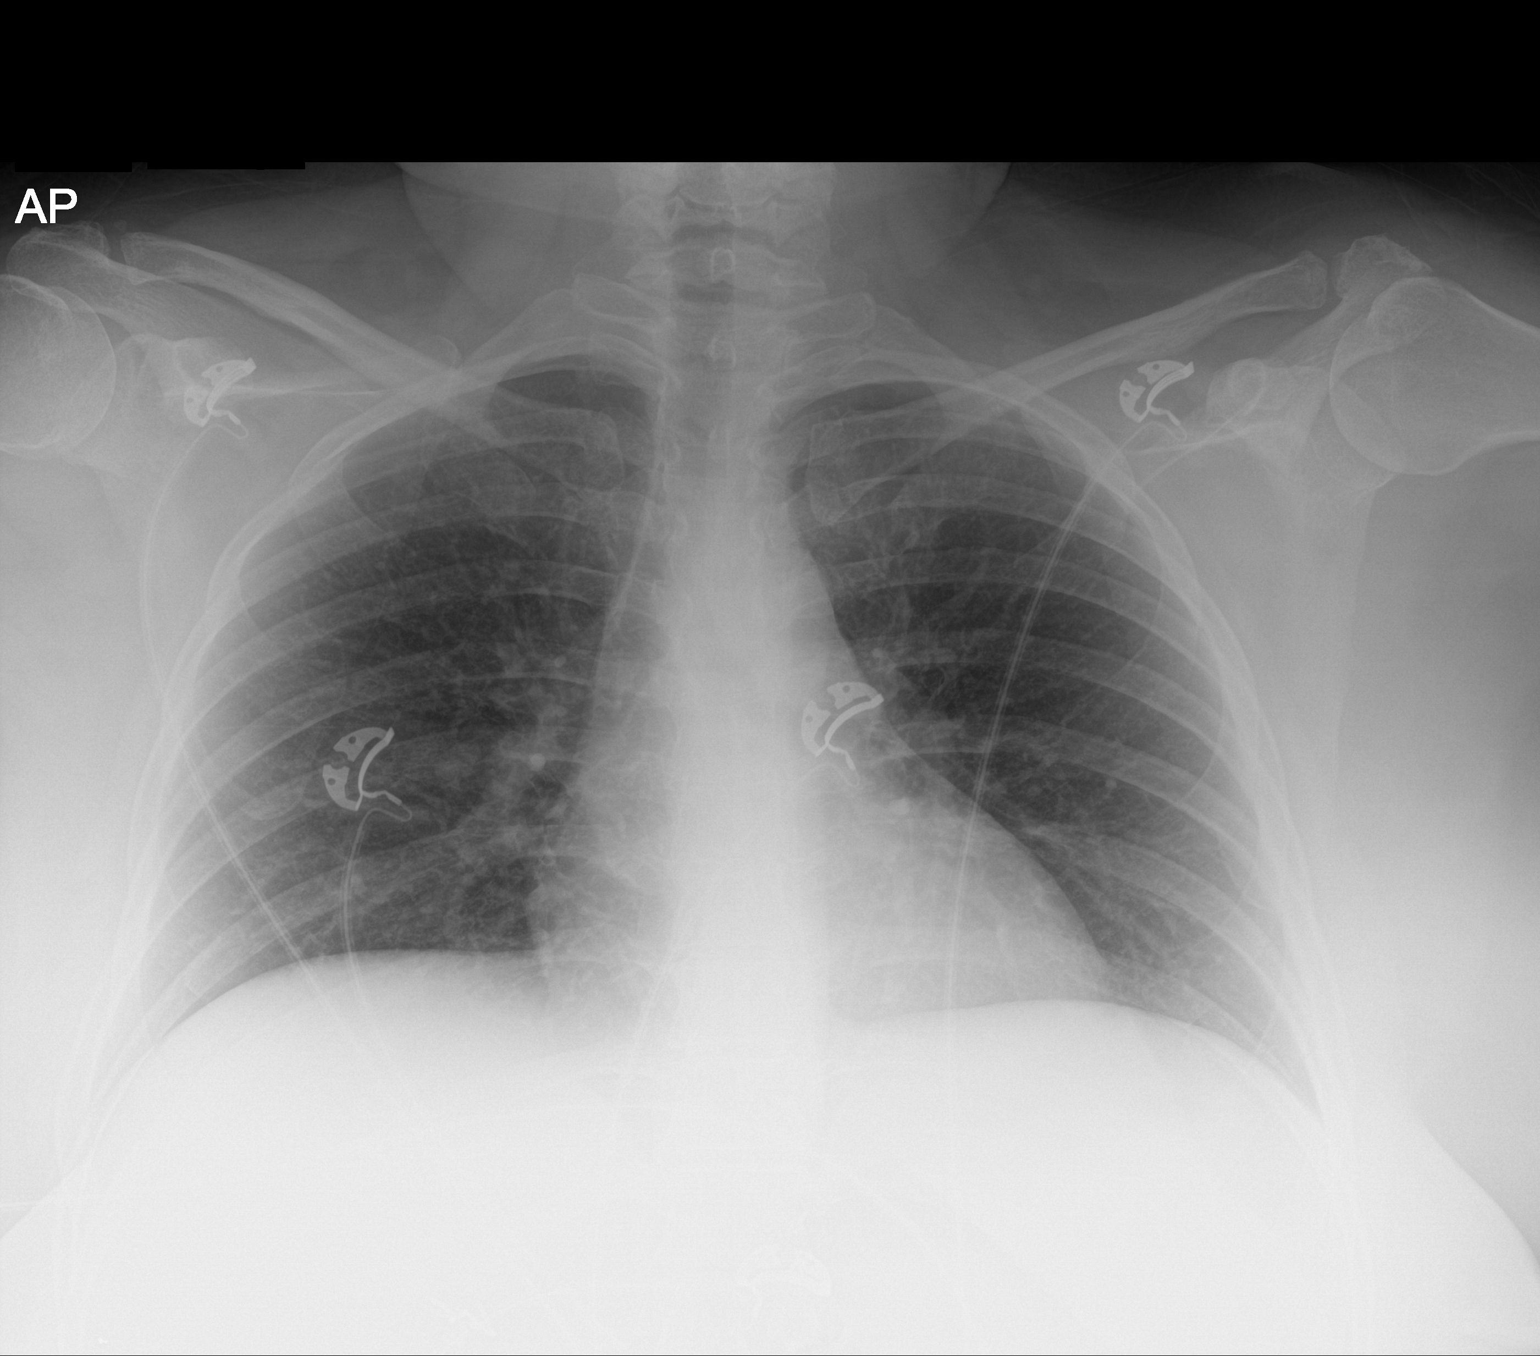

[1 of 1 positions shown; findings below may reference images not displayed]

FINDINGS: No consolidation, features of edema, pneumothorax, or effusion.
Pulmonary vascularity is normally distributed. The cardiomediastinal
contours are unremarkable. No acute osseous or soft tissue
abnormality. Telemetry leads overlie the chest.
IMPRESSION: No acute cardiopulmonary abnormality.

## 2021-11-11 IMAGING — CT CT ANGIO CHEST-ABD-PELV FOR DISSECTION W/ AND WO/W CM
2 of 9 series · 14 of 46 positions shown, 16 images · non-contrast
Comparison: CT abdomen pelvis dated 10/31/2010.

CLINICAL DATA: 49-year-old female with abdominal pain. Concern for
aortic dissection.

EXAM:
CT ANGIOGRAPHY CHEST, ABDOMEN AND PELVIS
TECHNIQUE: Non-contrast CT of the chest was initially obtained.

[Series 5: axial arterial · axial · arterial · 0.98mm/px · z∈[-475,+62]mm · 11 of 199 slices shown, 13 images]
[im 10/199  soft-tissue]
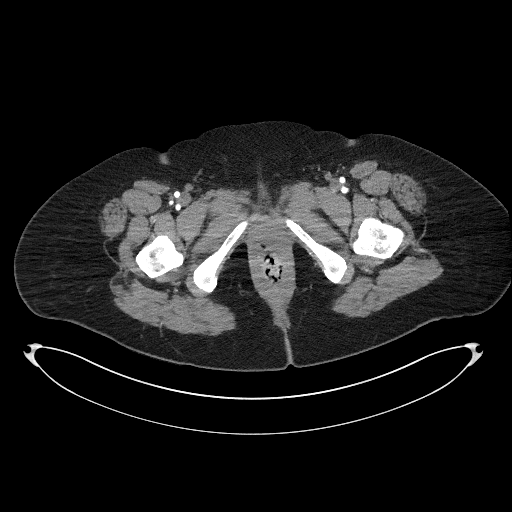
[im 10/199  bone]
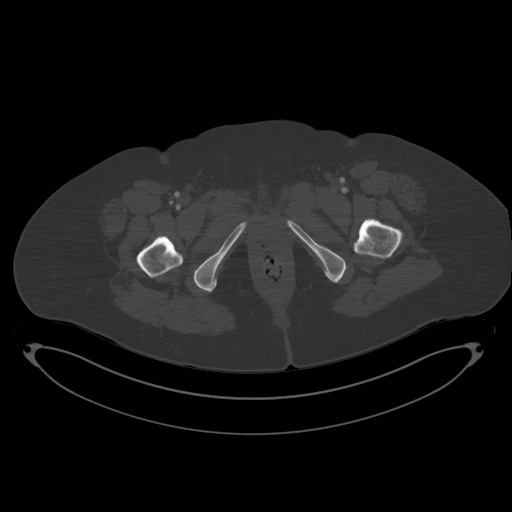
[im 30/199  soft-tissue]
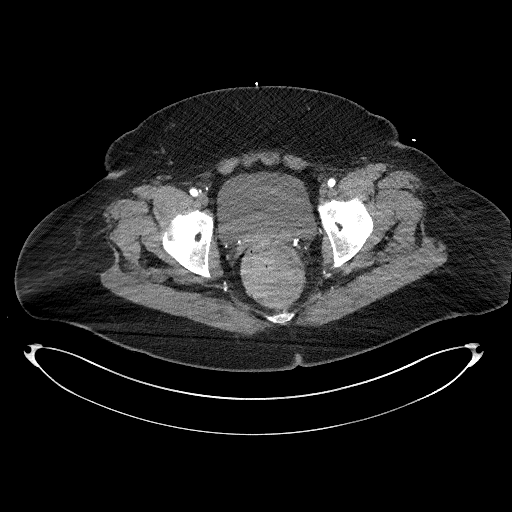
[im 50/199  soft-tissue]
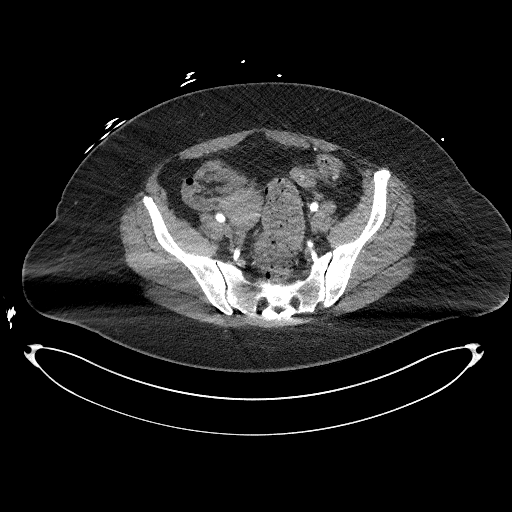
[im 70/199  soft-tissue]
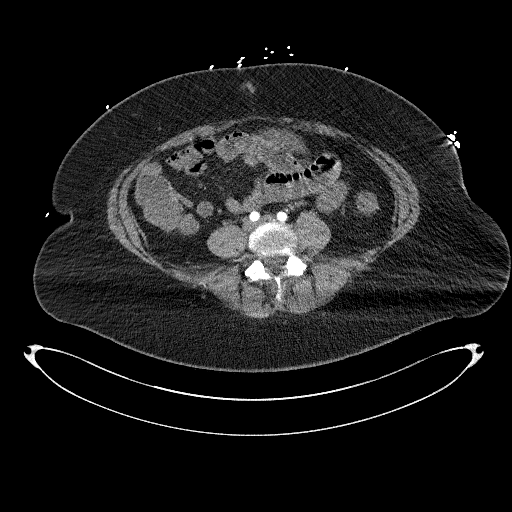
[im 80/199  soft-tissue]
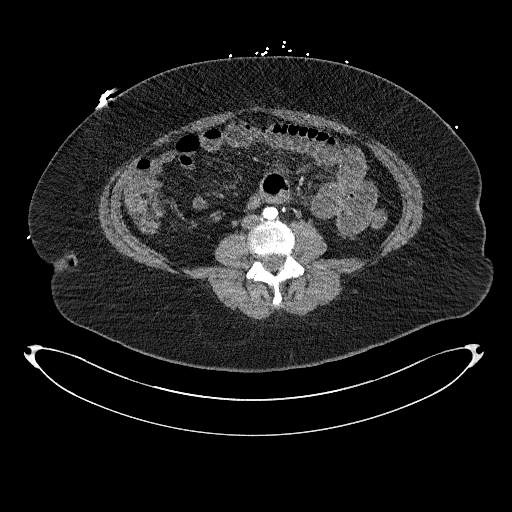
[im 100/199  soft-tissue]
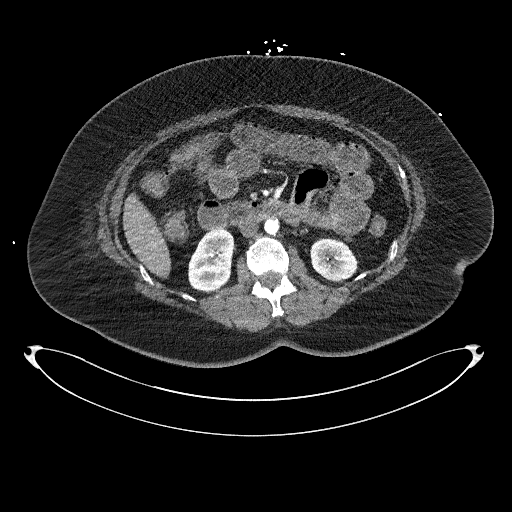
[im 119/199  soft-tissue]
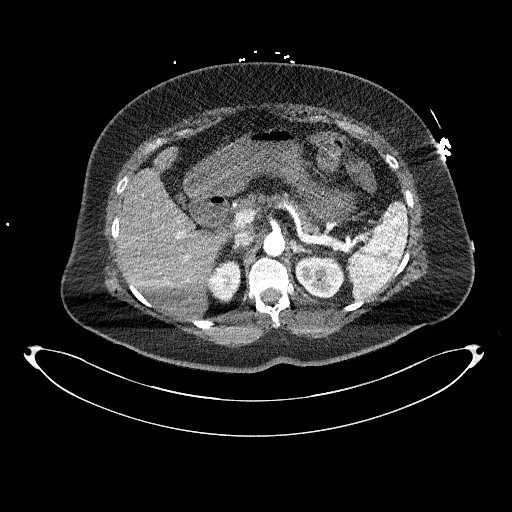
[im 129/199  soft-tissue]
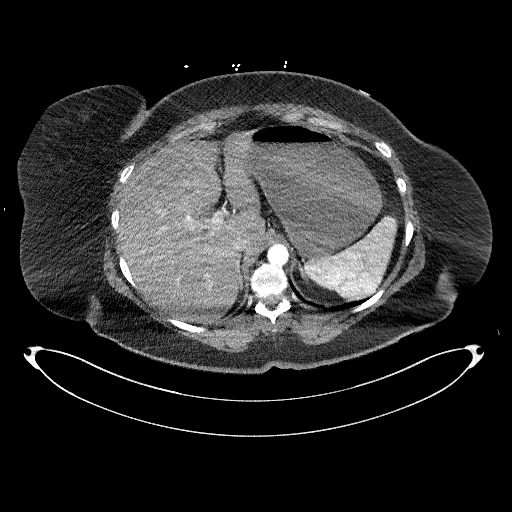
[im 149/199  soft-tissue]
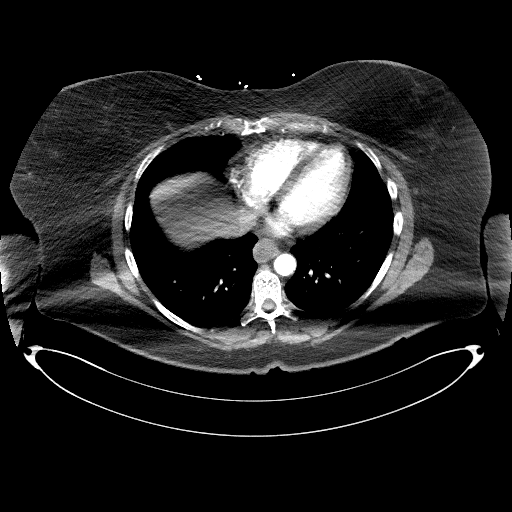
[im 149/199  bone]
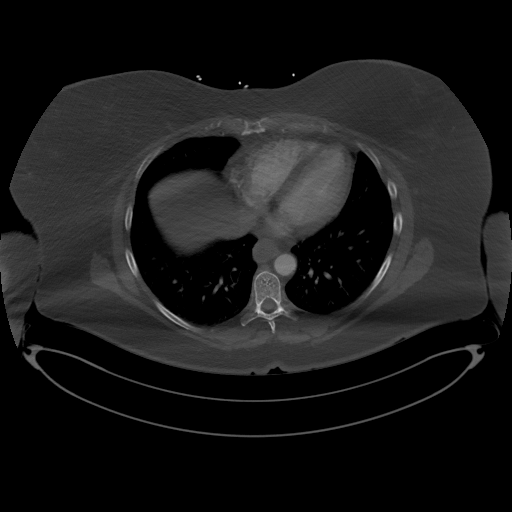
[im 169/199  soft-tissue]
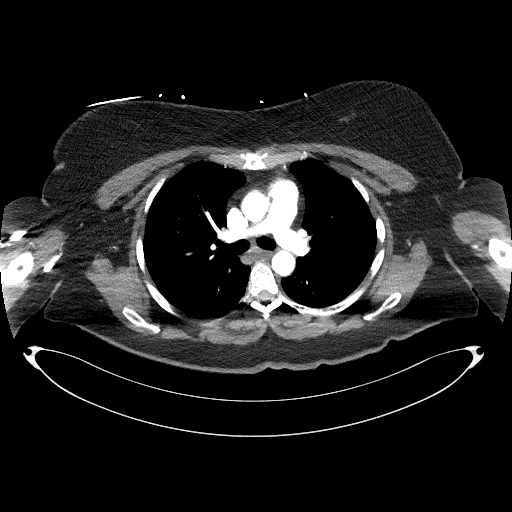
[im 189/199  soft-tissue]
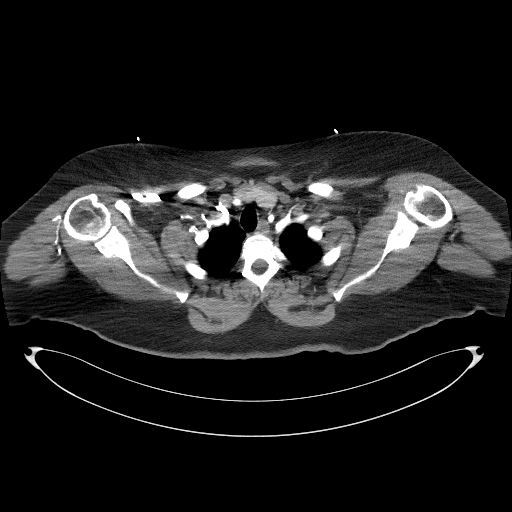

[Series 7: coronals · coronal · 0.91mm/px · 3 of 180 slices shown]
[im 45/180  soft-tissue]
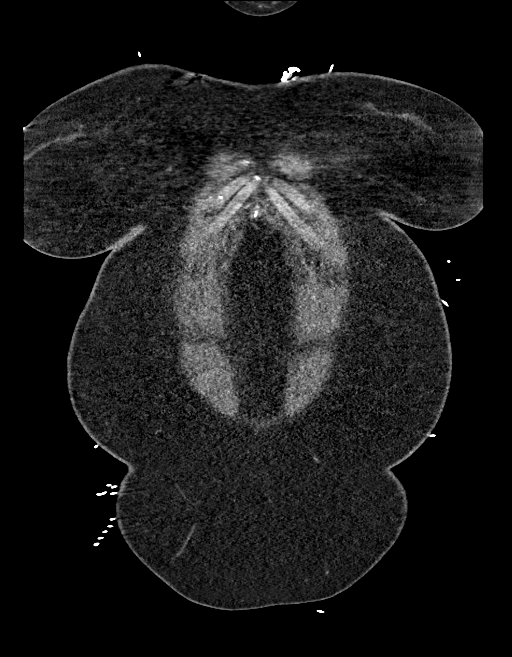
[im 90/180  soft-tissue]
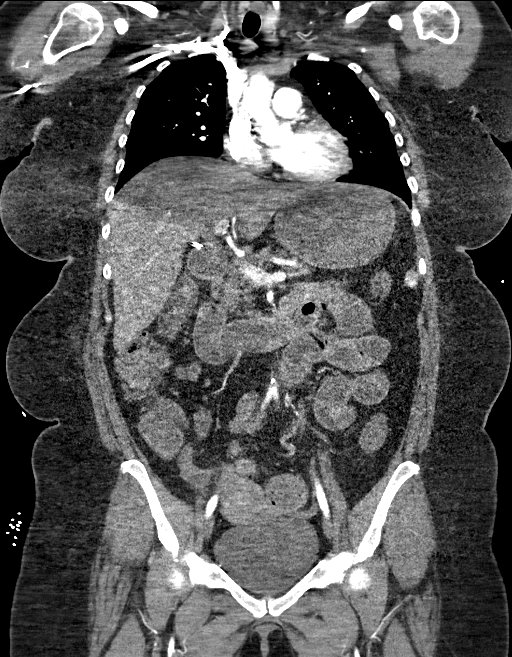
[im 135/180  soft-tissue]
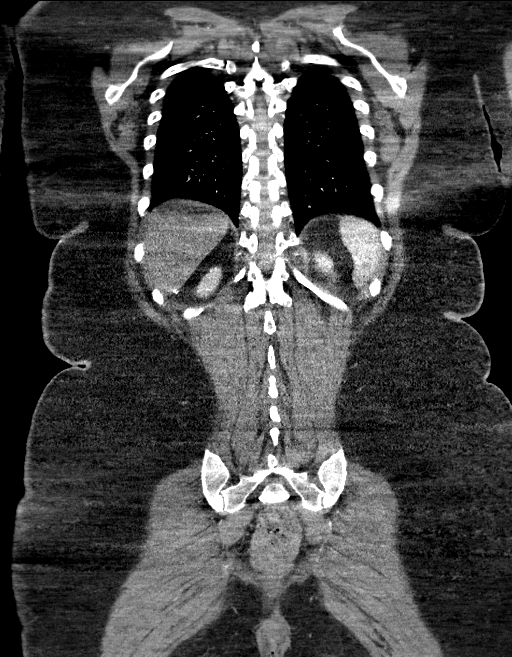

[14 of 46 positions shown; findings below may reference images not displayed]

Multidetector CT imaging through the chest, abdomen and pelvis was
performed using the standard protocol during bolus administration of
intravenous contrast. Multiplanar reconstructed images and MIPs were
obtained and reviewed to evaluate the vascular anatomy.

CONTRAST:  100mL OMNIPAQUE IOHEXOL 350 MG/ML SOLN
FINDINGS: Evaluation is limited due to streak artifact caused by patient's
arms and body habitus.

CTA CHEST FINDINGS

Cardiovascular: There is no cardiomegaly or pericardial effusion.
Minimal atherosclerotic calcification of the thoracic aorta. No
aneurysmal dilatation or dissection. The origins of the great
vessels of the aortic arch appear patent as visualized. Evaluation
of the pulmonary arteries is limited due to suboptimal opacification
and visualization of the peripheral branches. No large or central
pulmonary artery embolus identified.

Mediastinum/Nodes: There is no hilar or mediastinal adenopathy.
Fluid noted within the esophagus which may represent reflux or
delayed clearance. The thyroid gland is grossly unremarkable as
visualized. No mediastinal fluid collection.

Lungs/Pleura: The lungs are clear. There is no pleural effusion
pneumothorax. The central airways are patent.

Musculoskeletal: No chest wall abnormality. No acute or significant
osseous findings.

Review of the MIP images confirms the above findings.

CTA ABDOMEN AND PELVIS FINDINGS

VASCULAR

Aorta: Mild atherosclerotic calcification. No aneurysmal dilatation
or dissection. No periaortic inflammation.

Celiac: Patent without evidence of aneurysm, dissection, vasculitis
or significant stenosis.

SMA: Patent without evidence of aneurysm, dissection, vasculitis or
significant stenosis.

Renals: Both renal arteries are patent without evidence of aneurysm,
dissection, vasculitis, fibromuscular dysplasia or significant
stenosis.

IMA: Patent without evidence of aneurysm, dissection, vasculitis or
significant stenosis.

Inflow: Patent without evidence of aneurysm, dissection, vasculitis
or significant stenosis.

Veins: No obvious venous abnormality within the limitations of this
arterial phase study.

Review of the MIP images confirms the above findings.

NON-VASCULAR

No intra-abdominal free air or free fluid.

Hepatobiliary: Probable fatty infiltration of the liver. Evaluation
of the liver is very limited due to streak artifact. No intrahepatic
biliary ductal dilatation. Cholecystectomy.

Pancreas: The pancreas is unremarkable as visualized.

Spleen: Normal in size without focal abnormality.

Adrenals/Urinary Tract: The adrenal glands unremarkable. There is no
hydronephrosis on either side. The visualized ureters and urinary
bladder appear unremarkable.

Stomach/Bowel: The stomach is distended with fluid content. There is
sigmoid diverticulosis without active inflammatory changes. Multiple
top-normal caliber fluid-filled loops of proximal small bowel within
the abdomen measure up to 2.6 cm in diameter. There is mild
inflammatory changes and engorgement of the associated mesentery.
The distal small bowel and terminal ileum demonstrate a normal
caliber. Findings may represent enteritis with associated ileus or
and early small-bowel obstruction. Clinical correlation and
follow-up recommended. The colon is predominantly collapsed.
Appendectomy.

Lymphatic: No adenopathy.

Reproductive: The uterus is grossly unremarkable. No adnexal masses.

Other: None

Musculoskeletal: Degenerative changes primarily at L5-S1. No acute
osseous pathology.

Review of the MIP images confirms the above findings.
IMPRESSION: 1. No aortic dissection or aneurysm. No CT evidence of central
pulmonary artery embolus.
2. Findings may represent enteritis with associated ileus versus an
early small-bowel obstruction. Clinical correlation is recommended.
3. Sigmoid diverticulosis.

## 2023-09-14 ENCOUNTER — Encounter (HOSPITAL_BASED_OUTPATIENT_CLINIC_OR_DEPARTMENT_OTHER): Payer: Self-pay

## 2023-09-14 ENCOUNTER — Other Ambulatory Visit: Payer: Self-pay

## 2023-09-14 ENCOUNTER — Emergency Department (HOSPITAL_BASED_OUTPATIENT_CLINIC_OR_DEPARTMENT_OTHER)
Admission: EM | Admit: 2023-09-14 | Discharge: 2023-09-14 | Disposition: A | Discharge status: Home / Self Care | Attending: Emergency Medicine | Admitting: Emergency Medicine

## 2023-09-14 ENCOUNTER — Emergency Department (HOSPITAL_BASED_OUTPATIENT_CLINIC_OR_DEPARTMENT_OTHER)

## 2023-09-14 DIAGNOSIS — S99922A Unspecified injury of left foot, initial encounter: Secondary | ICD-10-CM | POA: Diagnosis present

## 2023-09-14 DIAGNOSIS — Z87891 Personal history of nicotine dependence: Secondary | ICD-10-CM | POA: Diagnosis not present

## 2023-09-14 DIAGNOSIS — W208XXA Other cause of strike by thrown, projected or falling object, initial encounter: Secondary | ICD-10-CM | POA: Insufficient documentation

## 2023-09-14 DIAGNOSIS — S91312A Laceration without foreign body, left foot, initial encounter: Secondary | ICD-10-CM | POA: Insufficient documentation

## 2023-09-14 DIAGNOSIS — I10 Essential (primary) hypertension: Secondary | ICD-10-CM | POA: Diagnosis not present

## 2023-09-14 DIAGNOSIS — Z79899 Other long term (current) drug therapy: Secondary | ICD-10-CM | POA: Insufficient documentation

## 2023-09-14 NOTE — ED Provider Notes (Addendum)
 [facility] AT [facility] [place] Provider Note   CSN: [id number] Arrival date & time: 09/14/23  1410     Patient presents with: Foot Injury   Alyssa Anthony is a 53 y.o. female.   Patient dropped a heavy wood sign on her left foot on the top of it today.  States tetanus is up-to-date.  Superficial laceration on the top of the forefoot more proximal.  Then distal denies any numbness does have some swelling locally.  Patient able to ambulate on it.  Past medical history noncontributory other than history of hypertension and depression.  Patient had her gallbladder removed and appendix removed.  Former smoker.  No other injuries.       Prior to Admission medications   Medication Sig Start Date End Date Taking? Authorizing Provider  amitriptyline (ELAVIL) 10 MG tablet Take 10 mg by mouth at bedtime.    [provider]  amLODipine (NORVASC) 2.5 MG tablet Take by mouth. 08/25/19 08/24/20  [provider]  escitalopram (LEXAPRO) 10 MG tablet Take 10 mg by mouth daily.    [provider]  meloxicam (MOBIC) 15 MG tablet Take 15 mg by mouth daily.    [provider]  ondansetron  (ZOFRAN -ODT) 4 MG disintegrating tablet Take 1 tablet (4 mg total) by mouth every 8 (eight) hours as needed for nausea or vomiting. 03/30/21   Dreama Longs, MD  pantoprazole  (PROTONIX ) 20 MG tablet Take 2 tablets (40 mg total) by mouth daily for 7 days. 03/30/21 04/06/21  Dreama Longs, MD  traMADol  (ULTRAM ) 50 MG tablet Take 1 tablet (50 mg total) by mouth every 6 (six) hours as needed for moderate pain. 02/04/20   Cheryle Page, MD  valACYclovir  (VALTREX ) 1000 MG tablet Take 1 tablet (1,000 mg total) by mouth as needed. 02/04/20   Cheryle Page, MD    Allergies: Prednisolone, Codeine, Morphine and codeine, and Other    Review of Systems  Constitutional:  Negative for chills and fever.  HENT:  Negative for ear pain and sore throat.   Eyes:  Negative  for pain and visual disturbance.  Respiratory:  Negative for cough and shortness of breath.   Cardiovascular:  Negative for chest pain and palpitations.  Gastrointestinal:  Negative for abdominal pain and vomiting.  Genitourinary:  Negative for dysuria and hematuria.  Musculoskeletal:  Negative for arthralgias and back pain.  Skin:  Positive for wound. Negative for color change and rash.  Neurological:  Negative for seizures and syncope.  All other systems reviewed and are negative.   Updated Vital Signs BP 124/78 (BP Location: Left Arm)   Pulse (!) 101   Temp 98.3 F (36.8 C) (Oral)   Resp 17   Ht 1.524 m (5')   Wt 88.5 kg   SpO2 97%   BMI 38.08 kg/m   Physical Exam Vitals and nursing note reviewed.  Constitutional:      General: She is not in acute distress.    Appearance: Normal appearance. She is well-developed.  HENT:     Head: Normocephalic and atraumatic.  Eyes:     Extraocular Movements: Extraocular movements intact.     Conjunctiva/sclera: Conjunctivae normal.     Pupils: Pupils are equal, round, and reactive to light.  Cardiovascular:     Rate and Rhythm: Normal rate and regular rhythm.     Heart sounds: No murmur heard. Pulmonary:     Effort: Pulmonary effort is normal. No respiratory distress.     Breath sounds:  Normal breath sounds.  Abdominal:     Palpations: Abdomen is soft.     Tenderness: There is no abdominal tenderness.  Musculoskeletal:        General: Tenderness and signs of injury present. No swelling.     Cervical back: Neck supple.     Comments: Left proximal forefoot anteriorly with about a 2 cm laceration that is superficial not even really into the dermis.  Linear in nature.  Some bruising around it little bit of swelling.  Distally cap refill is excellent dorsalis pedis pulse is 1+.  Sensation grossly intact can move toes fine.  No swelling or any injury around the ankle.  Skin:    General: Skin is warm and dry.     Capillary Refill:  Capillary refill takes less than 2 seconds.  Neurological:     Mental Status: She is alert.  Psychiatric:        Mood and Affect: Mood normal.     (all labs ordered are listed, but only abnormal results are displayed) Labs Reviewed - No data to display  EKG: None  Radiology: No results found.   Procedures   Medications Ordered in the ED - No data to display                                  Medical Decision Making Amount and/or Complexity of Data Reviewed Radiology: ordered.   X-ray of the left foot is pending.  Just to rule out fracture.  The superficial laceration can be just treated with wound care.  No suturing required not deep at all.  Distally in the left foot everything neurovascularly is intact.  With good cap refill good movement and sensation.  X-ray of the left foot with some soft tissue swelling but no evidence of any fracture or dislocation.  Patient stable for discharge home with wound care.  Final diagnoses:  Injury of left foot, initial encounter    ED Discharge Orders     None          Geraldene Hamilton, MD 09/14/23 1559    Geraldene Hamilton, MD 09/14/23 1616

## 2023-09-14 NOTE — Discharge Instructions (Signed)
 X-ray of the left foot without any bony abnormalities.  Would just recommend local wound care for the superficial laceration wash with soap and water apply antibiotic ointment twice a day.  Return for any new or worse symptoms.

## 2023-09-14 NOTE — ED Triage Notes (Signed)
 Pt states that she dropped a big wooden sign on her foot today. States that her foot stings and her toes feel stiff and tingly. Denies numbness. A small lac, bruising, and swelling noted.   Aleck FORBES Molt, RN
